# Patient Record
Sex: Female | Born: 1960 | Race: Black or African American | Hispanic: No | Marital: Married | Smoking: Current every day smoker
Health system: Southern US, Community
[De-identification: ages and names within clinical notes are randomized; demographics above are authoritative.]

## PROBLEM LIST (undated history)

## (undated) DIAGNOSIS — I219 Acute myocardial infarction, unspecified: Secondary | ICD-10-CM

## (undated) DIAGNOSIS — E7800 Pure hypercholesterolemia, unspecified: Secondary | ICD-10-CM

## (undated) DIAGNOSIS — I10 Essential (primary) hypertension: Secondary | ICD-10-CM

## (undated) DIAGNOSIS — G4730 Sleep apnea, unspecified: Secondary | ICD-10-CM

## (undated) DIAGNOSIS — I2510 Atherosclerotic heart disease of native coronary artery without angina pectoris: Secondary | ICD-10-CM

## (undated) DIAGNOSIS — E119 Type 2 diabetes mellitus without complications: Secondary | ICD-10-CM

## (undated) DIAGNOSIS — R42 Dizziness and giddiness: Secondary | ICD-10-CM

## (undated) DIAGNOSIS — I209 Angina pectoris, unspecified: Secondary | ICD-10-CM

## (undated) DIAGNOSIS — M1990 Unspecified osteoarthritis, unspecified site: Secondary | ICD-10-CM

## (undated) HISTORY — PX: ABDOMINAL HYSTERECTOMY: SHX81

## (undated) HISTORY — PX: FOOT SURGERY: SHX648

## (undated) HISTORY — PX: CARPAL TUNNEL RELEASE: SHX101

## (undated) HISTORY — PX: ELBOW SURGERY: SHX618

---

## 2012-07-11 ENCOUNTER — Other Ambulatory Visit: Payer: Self-pay | Admitting: Internal Medicine

## 2012-07-11 DIAGNOSIS — Z1231 Encounter for screening mammogram for malignant neoplasm of breast: Secondary | ICD-10-CM

## 2012-08-14 ENCOUNTER — Ambulatory Visit
Admission: RE | Admit: 2012-08-14 | Discharge: 2012-08-14 | Disposition: A | Discharge status: Home / Self Care | Payer: BC Managed Care – PPO | Source: Ambulatory Visit | Attending: Internal Medicine | Admitting: Internal Medicine

## 2012-08-14 DIAGNOSIS — Z1231 Encounter for screening mammogram for malignant neoplasm of breast: Secondary | ICD-10-CM

## 2012-08-20 ENCOUNTER — Encounter (HOSPITAL_COMMUNITY): Payer: Self-pay

## 2012-08-20 ENCOUNTER — Emergency Department (HOSPITAL_COMMUNITY)
Admission: EM | Admit: 2012-08-20 | Discharge: 2012-08-20 | Disposition: A | Discharge status: Home / Self Care | Payer: BC Managed Care – PPO | Attending: Emergency Medicine | Admitting: Emergency Medicine

## 2012-08-20 DIAGNOSIS — F17200 Nicotine dependence, unspecified, uncomplicated: Secondary | ICD-10-CM | POA: Insufficient documentation

## 2012-08-20 DIAGNOSIS — R209 Unspecified disturbances of skin sensation: Secondary | ICD-10-CM | POA: Insufficient documentation

## 2012-08-20 DIAGNOSIS — G5600 Carpal tunnel syndrome, unspecified upper limb: Secondary | ICD-10-CM

## 2012-08-20 NOTE — ED Notes (Signed)
Pt denies any injury to the hand or wrist, states that she just woke up this AM with this.

## 2012-08-20 NOTE — ED Notes (Signed)
Provider at bedside

## 2012-08-20 NOTE — Progress Notes (Signed)
Orthopedic Tech Progress Note Patient Details:  Tamara Copeland 12-05-1960 [id number] Velcro wrist splint applied to Right UE; tolerated well. Ortho Devices Type of Ortho Device: Velcro wrist splint Ortho Device/Splint Location: Right UE Ortho Device/Splint Interventions: Application   Asia R Thompson 08/20/2012, 10:42 AM

## 2012-08-20 NOTE — ED Provider Notes (Signed)
History     CSN: [id number]  Arrival date & time 08/20/12  0940   First MD Initiated Contact with Patient 08/20/12 [phone]      Chief Complaint  Patient presents with  . Arm Pain    Burning and tingling on right wrist with pain in the pinky.    (Consider location/radiation/quality/duration/timing/severity/associated sxs/prior treatment) HPI Comments: Patient presents today with a chief complaint of pain in her right wrist and numbness/tingling of the fingers of her right hand.  She reports that the symptoms woke her up from sleep last evening.  She reports that the numbness and tingling move from her arm distally to her fingers and does not move up her arm.  Both wrist pain and numbness of fingers worse when she is typing.  She denies any weakness.  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  She denies headache, vision changes, facial asymmetry, weakness, difficulty swallowing, or difficulty speaking.  She denies CP or SOB.  No history of HTN, Hyperlipidemia, or DM.  The history is provided by the patient.    No past medical history on file.  Past Surgical History  Procedure Date  . Foot surgery   . Abdominal hysterectomy     No family history on file.  History  Substance Use Topics  . Smoking status: Current Every Day Smoker -- 1.0 packs/day  . Smokeless tobacco: Not on file  . Alcohol Use: No    OB History    Grav Para Term Preterm Abortions TAB SAB Ect Mult Living                  Review of Systems  Constitutional: Negative for fever and chills.  HENT: Negative for neck pain and neck stiffness.   Respiratory: Negative for shortness of breath.   Cardiovascular: Negative for chest pain.  Musculoskeletal: Negative for gait problem.  Neurological: Positive for numbness. Negative for dizziness, syncope, facial asymmetry, weakness, light-headedness and headaches.  All other systems reviewed and are negative.    Allergies    Review of patient's allergies indicates no known allergies.  Home Medications  No current outpatient prescriptions on file.  BP 140/91  Pulse 77  Temp 98.7 F (37.1 C) (Oral)  Resp 16  Ht 5\' 3"  (1.6 m)  Wt 191 lb (86.637 kg)  BMI 33.83 kg/m2  SpO2 98%  Physical Exam  Nursing note and vitals reviewed. Constitutional: She appears well-developed and well-nourished. No distress.  HENT:  Head: Normocephalic and atraumatic.  Mouth/Throat: Oropharynx is clear and moist.  Eyes: EOM are normal. Pupils are equal, round, and reactive to light.  Neck: Normal range of motion. Neck supple.  Cardiovascular: Normal rate, regular rhythm, normal heart sounds and intact distal pulses.   Pulmonary/Chest: Effort normal and breath sounds normal.  Neurological: She is alert. She has normal strength. No cranial nerve deficit or sensory deficit. Coordination and gait normal.  Reflex Scores:      Brachioradialis reflexes are 2+ on the right side and 2+ on the left side.      Positive Phalen's sign with right wrist Positive Tinel's sign with right wrist Grip strength 5/5 bilaterally Distal sensation of all fingers of the right hand intact  Skin: Skin is warm and dry. She is not diaphoretic. No erythema.  Psychiatric: She has a normal mood and affect.    ED Course  Procedures (including critical care time)  Labs Reviewed - No data  to display No results found.   1. Carpal tunnel syndrome       MDM  Patient with symptoms consistent with Carpal Tunnel.  No focal weakness.  Normal neurological exam.  Positive Phalen's sign.  Patient given wrist splint and discharged home.  Return precautions discussed.          Pascal Lux Richmond, PA-C 08/20/12 2132

## 2012-08-27 NOTE — ED Provider Notes (Signed)
Medical screening examination/treatment/procedure(s) were performed by non-physician practitioner and as supervising physician I was immediately available for consultation/collaboration.   Carleene Cooper III, MD 08/27/12 2049

## 2013-11-07 IMAGING — MG MM DIGITAL SCREENING BILAT W/ CAD
4 series · 4 of 4 positions shown · non-contrast
Comparison: Previous exams.

***ADDENDUM*** CREATED: 08/20/2012 [DATE]
CLINICAL DATA: Screening.

DIGITAL BILATERAL SCREENING MAMMOGRAM WITH CAD
BILATERAL DIGITAL SCREENING MAMMOGRAM WITH CAD

[R CC]
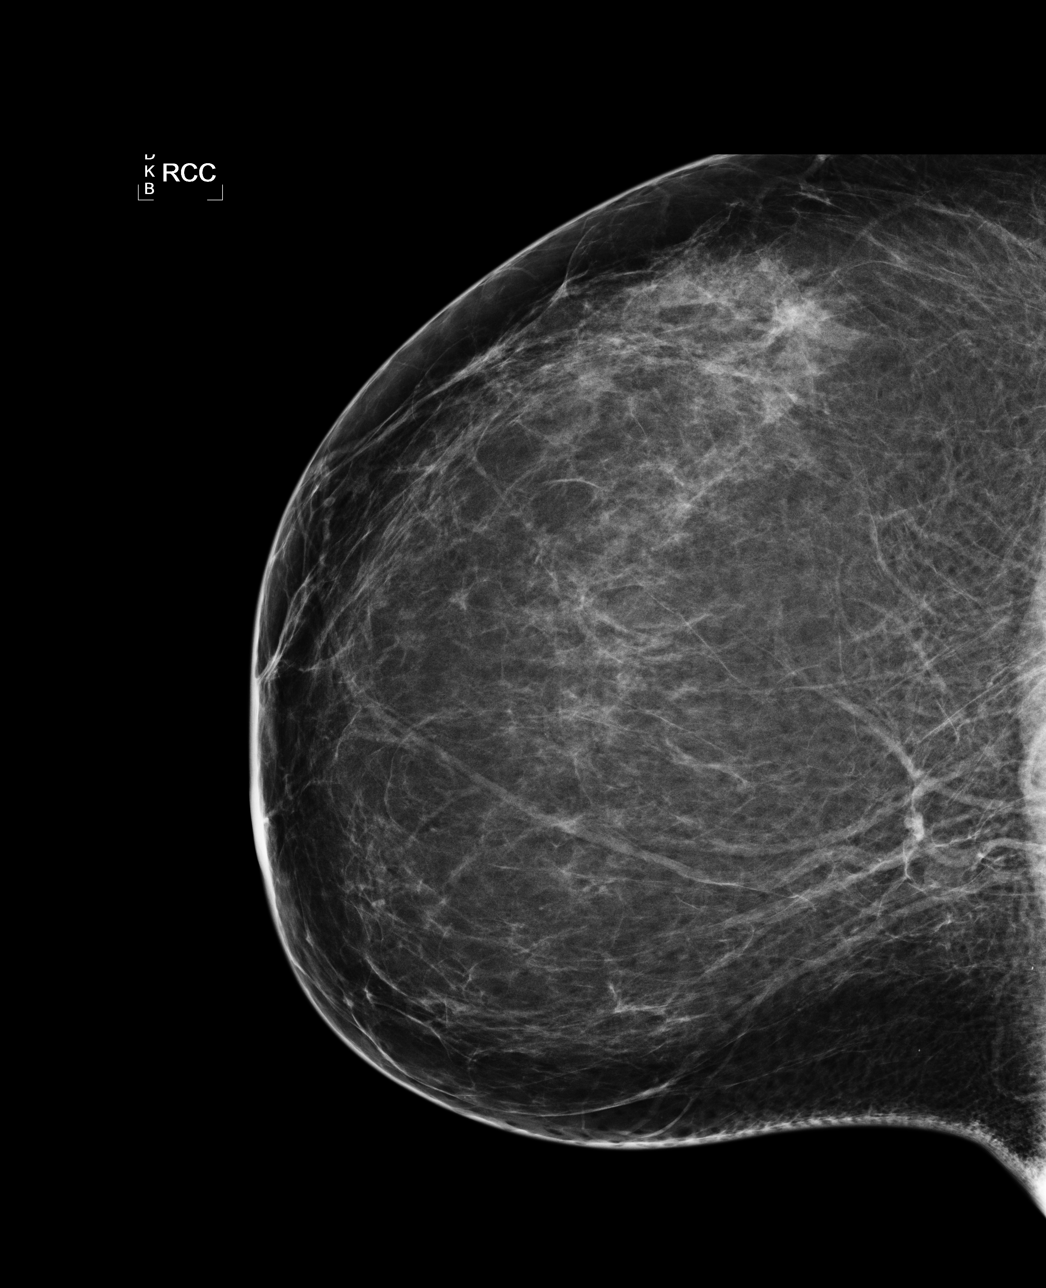

[L CC]
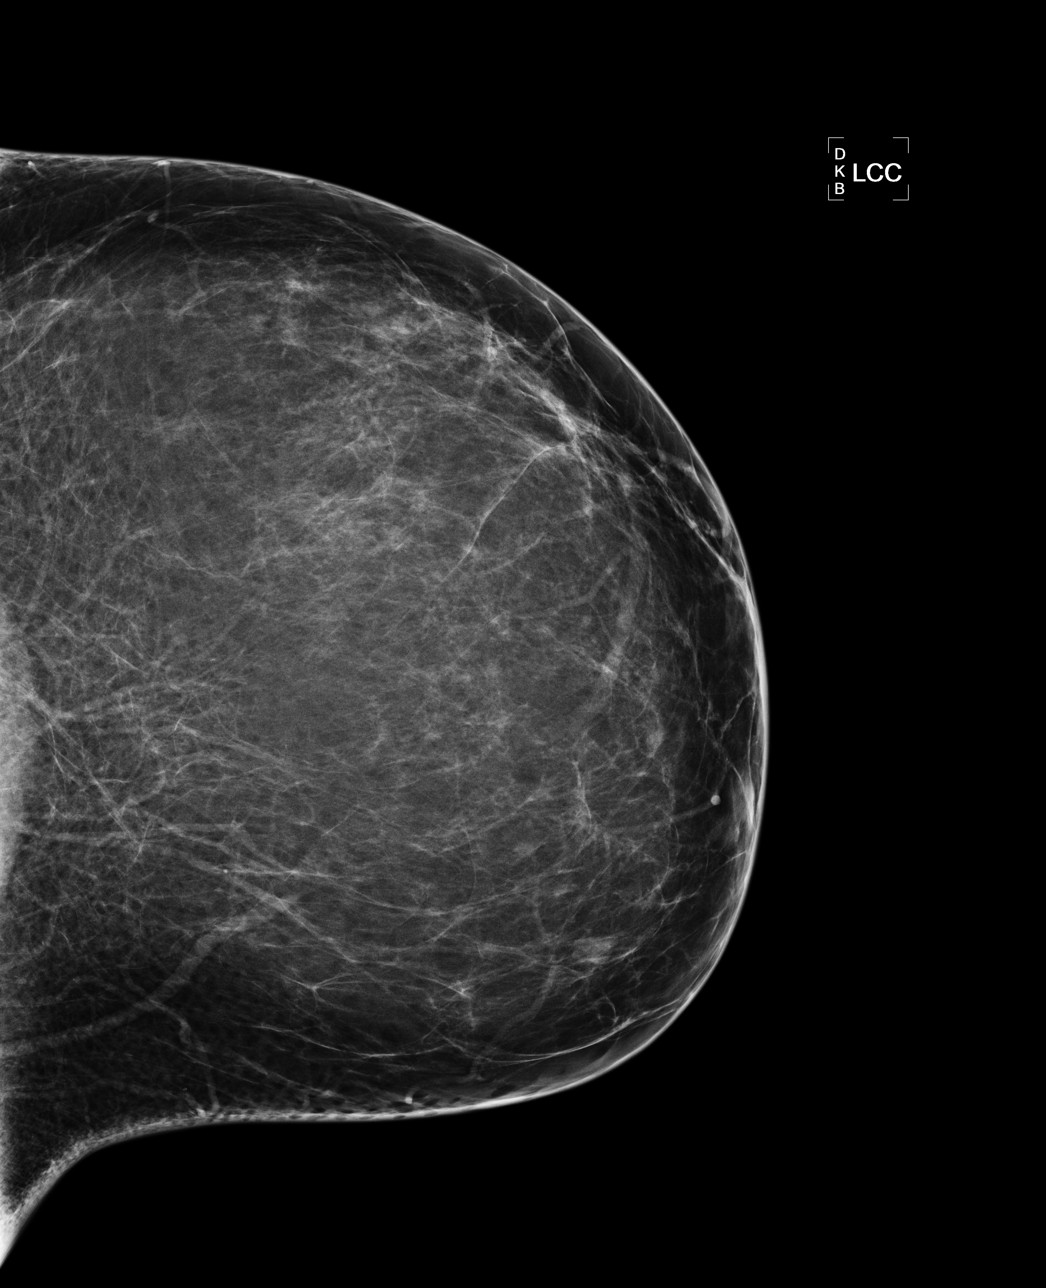

[L MLO]
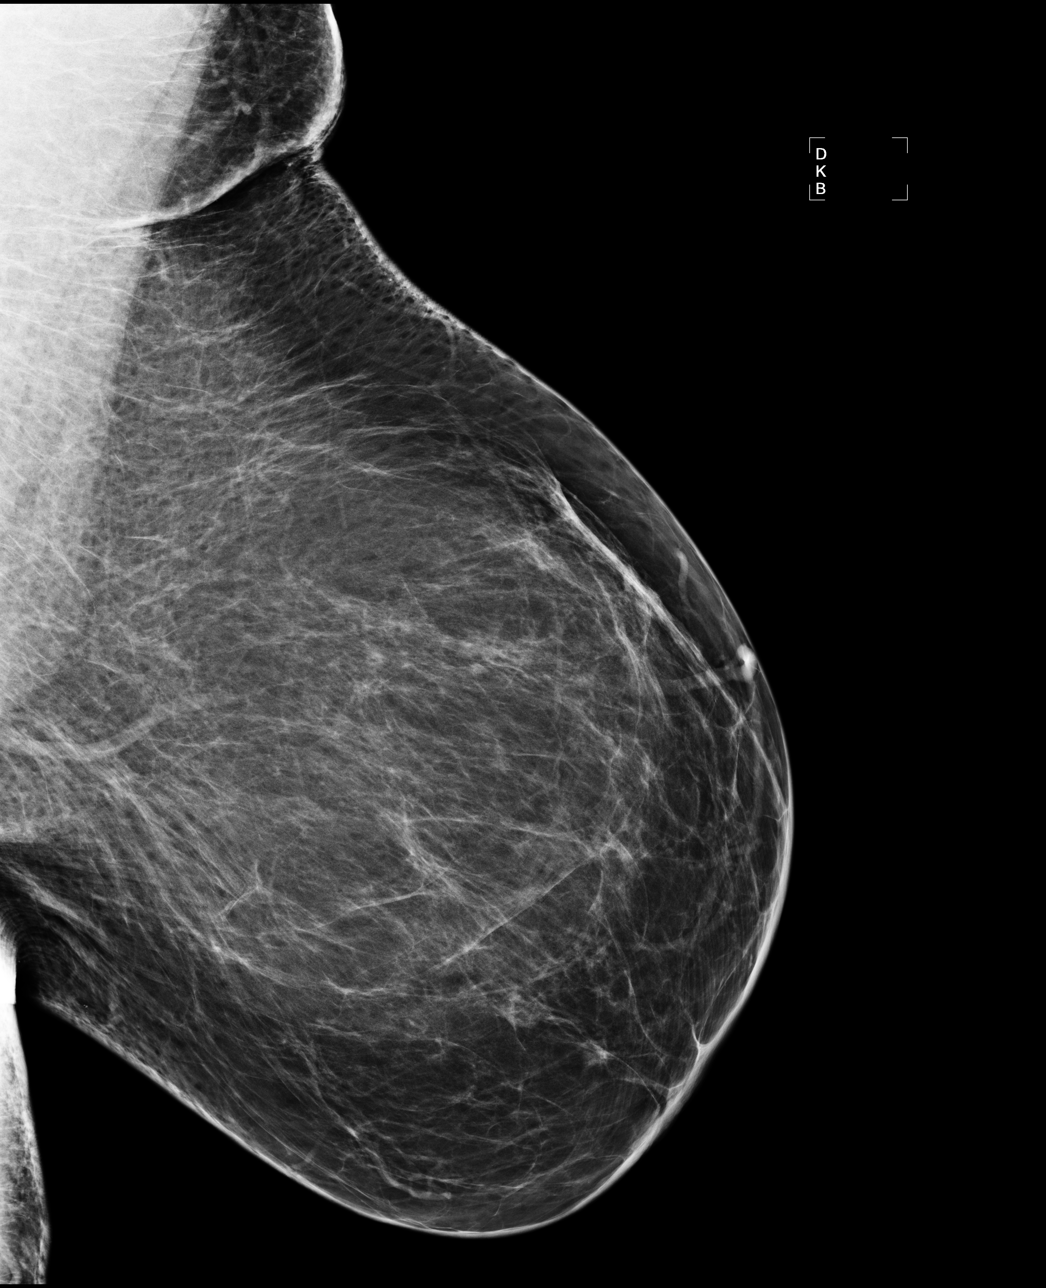

[R MLO]
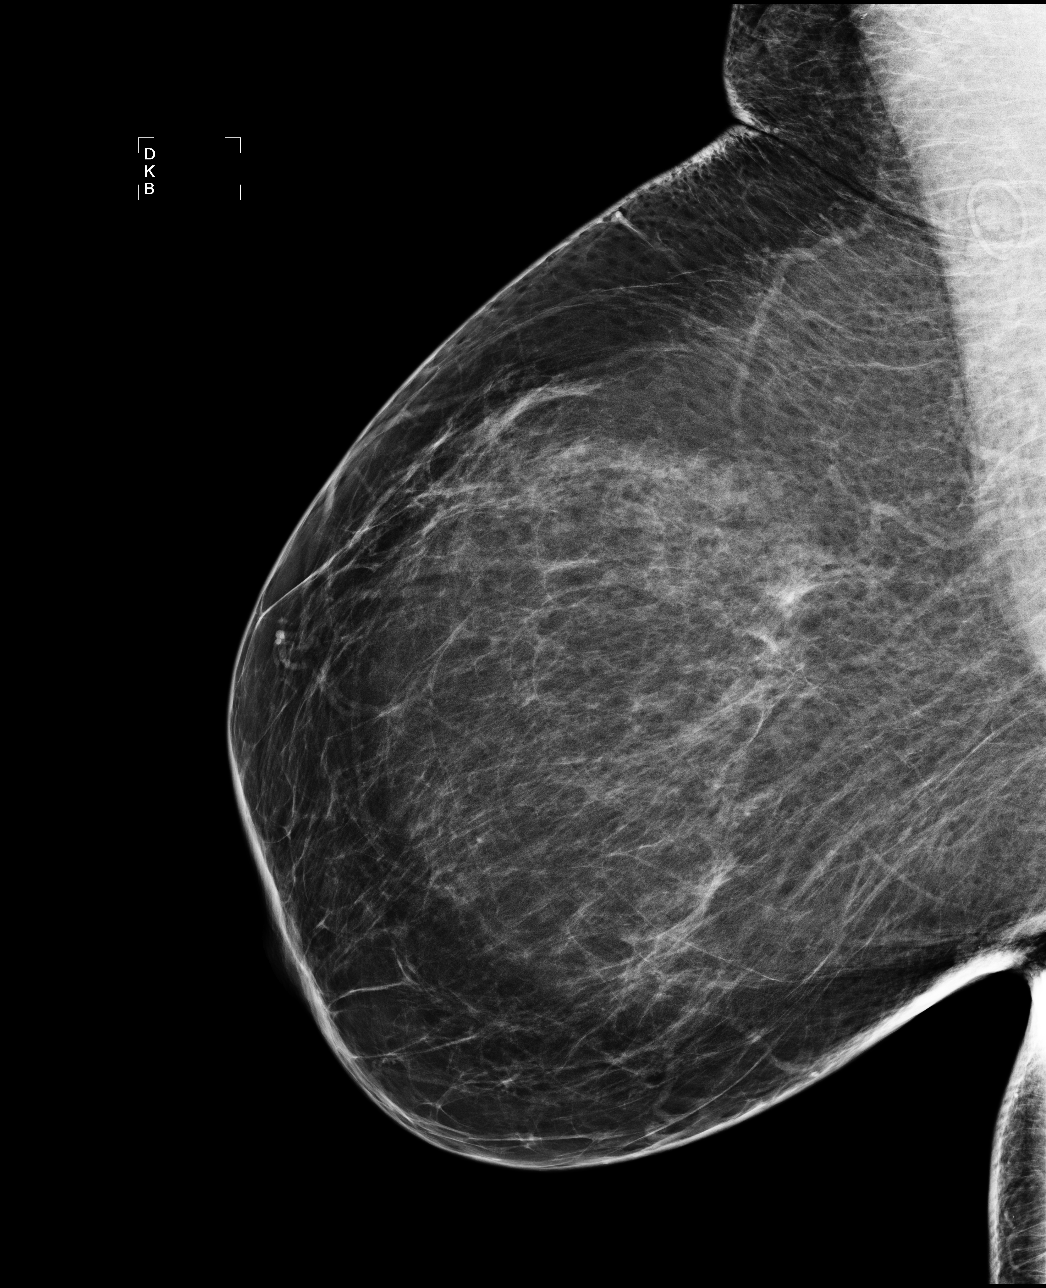

[4 of 4 positions shown; findings below may reference images not displayed]

FINDINGS: There are scattered fibroglandular densities. No
suspicious masses, architectural distortion, or calcifications are
present.

Images were processed with CAD.
IMPRESSION: No mammographic evidence of malignancy.

A result letter of this screening mammogram will be mailed directly
to the patient.

RECOMMENDATION:
Screening mammogram in one year. (Code:9V-[id number])

BI-RADS CATEGORY 1:  Negative.

***END ADDENDUM*** SIGNED BY: Qomandan Tiger, M.D.
FINDINGS: Prior films are needed for interpretation.
IMPRESSION: Prior exams will be requested for comparison.  Following comparison
with prior studies an addendum will be made regarding further
recommendations.

Images were processed with CAD.

BI-RADS CATEGORY 0:  Incomplete.  Need additional imaging
evaluation and/or prior mammograms for comparison.

## 2014-05-19 ENCOUNTER — Ambulatory Visit (INDEPENDENT_AMBULATORY_CARE_PROVIDER_SITE_OTHER): Payer: BC Managed Care – PPO

## 2014-05-19 ENCOUNTER — Encounter: Payer: Self-pay | Admitting: Podiatry

## 2014-05-19 ENCOUNTER — Ambulatory Visit (INDEPENDENT_AMBULATORY_CARE_PROVIDER_SITE_OTHER): Payer: BC Managed Care – PPO | Admitting: Podiatry

## 2014-05-19 VITALS — BP 129/85 | HR 85 | Resp 16 | Ht 63.0 in | Wt 191.0 lb

## 2014-05-19 DIAGNOSIS — M21619 Bunion of unspecified foot: Secondary | ICD-10-CM

## 2014-05-19 DIAGNOSIS — M2010 Hallux valgus (acquired), unspecified foot: Secondary | ICD-10-CM

## 2014-05-19 DIAGNOSIS — M2140 Flat foot [pes planus] (acquired), unspecified foot: Secondary | ICD-10-CM

## 2014-05-19 DIAGNOSIS — M2142 Flat foot [pes planus] (acquired), left foot: Secondary | ICD-10-CM

## 2014-05-19 DIAGNOSIS — M2141 Flat foot [pes planus] (acquired), right foot: Secondary | ICD-10-CM

## 2014-05-19 NOTE — Progress Notes (Signed)
   Subjective:    Patient ID: Tamara Copeland, female    DOB: 01/02/1961, 53 y.o.   MRN: [id number]  HPI Comments: My feet have been hurting me since i was 53 years old. I have had orthotics and surgery. The bottom of the feet hurt, mostly in the ball of the foot. It is a constant sting. It just seems to be getting worse. Having back problems now and dont know if it is feet related. I have painful callus on the bottom of the feet as well. Denies any trauma. No other complaints at this time.   Foot Pain      Review of Systems  All other systems reviewed and are negative.      Objective:   Physical Exam AAO x3, NAD DP/PT pulses palpable b/l. CRT < 3 sec Decrease in the medial arch heigh upon weightbearing. Able to preform single and double heel rise. Mild equinus.  Diffuse discomfort across the metatarsal heads with atrophy of the plantar fat pa b/l. Mild HAV MMT 5/5  No open lesions      Assessment & Plan:  53 year old female with a bilateral pes planus, metatarsalgia. -X-rays were obtained. See the x-ray report for full details. No acute fracture  -Discussed both conservative and surgical intervention with the patient in detail including alternatives, risks, complications. -At this time recommended orthotic therapy to help support the arch of her feet as old provide padding to the metatarsal heads. Patient will think about custom orthotics and will call the office to be scanned. -Dispensed metatarsal pads to help offload the metatarsal heads. -Followup as needed

## 2023-08-22 ENCOUNTER — Ambulatory Visit (HOSPITAL_BASED_OUTPATIENT_CLINIC_OR_DEPARTMENT_OTHER)
Admission: RE | Admit: 2023-08-22 | Discharge: 2023-08-22 | Disposition: A | Discharge status: Home / Self Care | Payer: Medicare HMO | Source: Ambulatory Visit | Attending: Adult Health Nurse Practitioner | Admitting: Adult Health Nurse Practitioner

## 2023-08-22 ENCOUNTER — Other Ambulatory Visit (HOSPITAL_BASED_OUTPATIENT_CLINIC_OR_DEPARTMENT_OTHER): Payer: Self-pay | Admitting: Adult Health Nurse Practitioner

## 2023-08-22 DIAGNOSIS — M545 Low back pain, unspecified: Secondary | ICD-10-CM

## 2023-08-27 HISTORY — PX: RIGHT AND LEFT HEART CATH: CATH118262

## 2023-11-18 ENCOUNTER — Other Ambulatory Visit: Payer: Self-pay | Admitting: Neurosurgery

## 2023-12-02 ENCOUNTER — Other Ambulatory Visit: Payer: Self-pay | Admitting: Neurosurgery

## 2023-12-10 NOTE — Pre-Procedure Instructions (Signed)
 Surgical Instructions   Your procedure is scheduled on December 25, 2023. Report to [facility] Main Entrance "A" at 5:30 A.M., then check in with the Admitting office. Any questions or running late day of surgery: call [phone]  Questions prior to your surgery date: call [phone], Monday-Friday, 8am-4pm. If you experience any cold or flu symptoms such as cough, fever, chills, shortness of breath, etc. between now and your scheduled surgery, please notify us at the above number.     Remember:  Do not eat or drink after midnight the night before your surgery    Take these medicines the morning of surgery with A SIP OF WATER: amLODipine (NORVASC)  metoprolol tartrate (LOPRESSOR)    May take these medicines IF NEEDED: albuterol (VENTOLIN HFA) inhaler - please bring inhaler with you morning of surgery nitroGLYCERIN (NITROSTAT) - if dose taken prior to surgery, please call either of the above phone numbers   Follow your surgeon's instructions on when to stop Aspirin.  If no instructions were given by your surgeon then you will need to call the office to get those instructions.     One week prior to surgery, STOP taking any Aleve, Naproxen, Ibuprofen, Motrin, Advil, Goody's, BC's, all herbal medications, fish oil, and non-prescription vitamins.   WHAT DO I DO ABOUT MY DIABETES MEDICATION?   Do not take metFORMIN (GLUCOPHAGE) or pioglitazone (ACTOS) the morning of surgery.  THE NIGHT BEFORE SURGERY, take 5 units of insulin glargine (LANTUS SOLOSTAR).      STOP taking your empagliflozin (JARDIANCE) three days prior to surgery. Your last dose will be March 22nd.   HOW TO MANAGE YOUR DIABETES BEFORE AND AFTER SURGERY  Why is it important to control my blood sugar before and after surgery? Improving blood sugar levels before and after surgery helps healing and can limit problems. A way of improving blood sugar control is eating a healthy diet by:  Eating less sugar and  carbohydrates  Increasing activity/exercise  Talking with your doctor about reaching your blood sugar goals High blood sugars (greater than 180 mg/dL) can raise your risk of infections and slow your recovery, so you will need to focus on controlling your diabetes during the weeks before surgery. Make sure that the doctor who takes care of your diabetes knows about your planned surgery including the date and location.  How do I manage my blood sugar before surgery? Check your blood sugar at least 4 times a day, starting 2 days before surgery, to make sure that the level is not too high or low.  Check your blood sugar the morning of your surgery when you wake up and every 2 hours until you get to the Short Stay unit.  If your blood sugar is less than 70 mg/dL, you will need to treat for low blood sugar: Do not take insulin. Treat a low blood sugar (less than 70 mg/dL) with  cup of clear juice (cranberry or apple), 4 glucose tablets, OR glucose gel. Recheck blood sugar in 15 minutes after treatment (to make sure it is greater than 70 mg/dL). If your blood sugar is not greater than 70 mg/dL on recheck, call [phone] for further instructions. Report your blood sugar to the short stay nurse when you get to Short Stay.  If you are admitted to the hospital after surgery: Your blood sugar will be checked by the staff and you will probably be given insulin after surgery (instead of oral diabetes medicines) to make sure you have good  blood sugar levels. The goal for blood sugar control after surgery is 80-180 mg/dL.                      Do NOT Smoke (Tobacco/Vaping) for 24 hours prior to your procedure.  If you use a CPAP at night, you may bring your mask/headgear for your overnight stay.   You will be asked to remove any contacts, glasses, piercing's, hearing aid's, dentures/partials prior to surgery. Please bring cases for these items if needed.    Patients discharged the day of surgery will  not be allowed to drive home, and someone needs to stay with them for 24 hours.  SURGICAL WAITING ROOM VISITATION Patients may have no more than 2 support people in the waiting area - these visitors may rotate.   Pre-op nurse will coordinate an appropriate time for 1 ADULT support person, who may not rotate, to accompany patient in pre-op.  Children under the age of 50 must have an adult with them who is not the patient and must remain in the main waiting area with an adult.  If the patient needs to stay at the hospital during part of their recovery, the visitor guidelines for inpatient rooms apply.  Please refer to the [facility] website for the visitor guidelines for any additional information.   If you received a COVID test during your pre-op visit  it is requested that you wear a mask when out in public, stay away from anyone that may not be feeling well and notify your surgeon if you develop symptoms. If you have been in contact with anyone that has tested positive in the last 10 days please notify you surgeon.      Pre-operative 5 CHG Bathing Instructions   You can play a key role in reducing the risk of infection after surgery. Your skin needs to be as free of germs as possible. You can reduce the number of germs on your skin by washing with CHG (chlorhexidine gluconate) soap before surgery. CHG is an antiseptic soap that kills germs and continues to kill germs even after washing.   DO NOT use if you have an allergy to chlorhexidine/CHG or antibacterial soaps. If your skin becomes reddened or irritated, stop using the CHG and notify one of our RNs at [phone].   Please shower with the CHG soap starting 4 days before surgery using the following schedule:     Please keep in mind the following:  DO NOT shave, including legs and underarms, starting the day of your first shower.   You may shave your face at any point before/day of surgery.  Place clean sheets on your bed the day  you start using CHG soap. Use a clean washcloth (not used since being washed) for each shower. DO NOT sleep with pets once you start using the CHG.   CHG Shower Instructions:  Wash your face and private area with normal soap. If you choose to wash your hair, wash first with your normal shampoo.  After you use shampoo/soap, rinse your hair and body thoroughly to remove shampoo/soap residue.  Turn the water OFF and apply about 3 tablespoons (45 ml) of CHG soap to a CLEAN washcloth.  Apply CHG soap ONLY FROM YOUR NECK DOWN TO YOUR TOES (washing for 3-5 minutes)  DO NOT use CHG soap on face, private areas, open wounds, or sores.  Pay special attention to the area where your surgery is being performed.  If you  are having back surgery, having someone wash your back for you may be helpful. Wait 2 minutes after CHG soap is applied, then you may rinse off the CHG soap.  Pat dry with a clean towel  Put on clean clothes/pajamas   If you choose to wear lotion, please use ONLY the CHG-compatible lotions that are listed below.  Additional instructions for the day of surgery: DO NOT APPLY any lotions, deodorants, cologne, or perfumes.   Do not bring valuables to the hospital. [facility] is not responsible for any belongings/valuables. Do not wear nail polish, gel polish, artificial nails, or any other type of covering on natural nails (fingers and toes) Do not wear jewelry or makeup Put on clean/comfortable clothes.  Please brush your teeth.  Ask your nurse before applying any prescription medications to the skin.     CHG Compatible Lotions   Aveeno Moisturizing lotion  Cetaphil Moisturizing Cream  Cetaphil Moisturizing Lotion  Clairol Herbal Essence Moisturizing Lotion, Dry Skin  Clairol Herbal Essence Moisturizing Lotion, Extra Dry Skin  Clairol Herbal Essence Moisturizing Lotion, Normal Skin  Curel Age Defying Therapeutic Moisturizing Lotion with Alpha Hydroxy  Curel Extreme Care Body  Lotion  Curel Soothing Hands Moisturizing Hand Lotion  Curel Therapeutic Moisturizing Cream, Fragrance-Free  Curel Therapeutic Moisturizing Lotion, Fragrance-Free  Curel Therapeutic Moisturizing Lotion, Original Formula  Eucerin Daily Replenishing Lotion  Eucerin Dry Skin Therapy Plus Alpha Hydroxy Crme  Eucerin Dry Skin Therapy Plus Alpha Hydroxy Lotion  Eucerin Original Crme  Eucerin Original Lotion  Eucerin Plus Crme Eucerin Plus Lotion  Eucerin TriLipid Replenishing Lotion  Keri Anti-Bacterial Hand Lotion  Keri Deep Conditioning Original Lotion Dry Skin Formula Softly Scented  Keri Deep Conditioning Original Lotion, Fragrance Free Sensitive Skin Formula  Keri Lotion Fast Absorbing Fragrance Free Sensitive Skin Formula  Keri Lotion Fast Absorbing Softly Scented Dry Skin Formula  Keri Original Lotion  Keri Skin Renewal Lotion Keri Silky Smooth Lotion  Keri Silky Smooth Sensitive Skin Lotion  Nivea Body Creamy Conditioning Oil  Nivea Body Extra Enriched Lotion  Nivea Body Original Lotion  Nivea Body Sheer Moisturizing Lotion Nivea Crme  Nivea Skin Firming Lotion  NutraDerm 30 Skin Lotion  NutraDerm Skin Lotion  NutraDerm Therapeutic Skin Cream  NutraDerm Therapeutic Skin Lotion  ProShield Protective Hand Cream  Provon moisturizing lotion  Please read over the following fact sheets that you were given.

## 2023-12-11 ENCOUNTER — Encounter (HOSPITAL_COMMUNITY)
Admission: RE | Admit: 2023-12-11 | Discharge: 2023-12-11 | Disposition: A | Discharge status: Home / Self Care | Payer: Medicare HMO | Source: Ambulatory Visit | Attending: Neurosurgery | Admitting: Neurosurgery

## 2023-12-11 ENCOUNTER — Other Ambulatory Visit: Payer: Self-pay

## 2023-12-11 ENCOUNTER — Encounter (HOSPITAL_COMMUNITY): Payer: Self-pay

## 2023-12-11 VITALS — BP 141/75 | HR 56 | Temp 98.2°F | Resp 17 | Ht 63.0 in | Wt 189.3 lb

## 2023-12-11 DIAGNOSIS — E7800 Pure hypercholesterolemia, unspecified: Secondary | ICD-10-CM | POA: Insufficient documentation

## 2023-12-11 DIAGNOSIS — G4733 Obstructive sleep apnea (adult) (pediatric): Secondary | ICD-10-CM | POA: Diagnosis not present

## 2023-12-11 DIAGNOSIS — E119 Type 2 diabetes mellitus without complications: Secondary | ICD-10-CM | POA: Diagnosis not present

## 2023-12-11 DIAGNOSIS — Z01818 Encounter for other preprocedural examination: Secondary | ICD-10-CM | POA: Diagnosis present

## 2023-12-11 DIAGNOSIS — J039 Acute tonsillitis, unspecified: Secondary | ICD-10-CM | POA: Insufficient documentation

## 2023-12-11 DIAGNOSIS — M4712 Other spondylosis with myelopathy, cervical region: Secondary | ICD-10-CM | POA: Diagnosis not present

## 2023-12-11 DIAGNOSIS — Z794 Long term (current) use of insulin: Secondary | ICD-10-CM | POA: Diagnosis not present

## 2023-12-11 DIAGNOSIS — Z01812 Encounter for preprocedural laboratory examination: Secondary | ICD-10-CM | POA: Diagnosis not present

## 2023-12-11 DIAGNOSIS — I252 Old myocardial infarction: Secondary | ICD-10-CM | POA: Insufficient documentation

## 2023-12-11 DIAGNOSIS — Z955 Presence of coronary angioplasty implant and graft: Secondary | ICD-10-CM | POA: Insufficient documentation

## 2023-12-11 DIAGNOSIS — I2510 Atherosclerotic heart disease of native coronary artery without angina pectoris: Secondary | ICD-10-CM | POA: Insufficient documentation

## 2023-12-11 DIAGNOSIS — Z87891 Personal history of nicotine dependence: Secondary | ICD-10-CM | POA: Diagnosis not present

## 2023-12-11 DIAGNOSIS — I10 Essential (primary) hypertension: Secondary | ICD-10-CM | POA: Diagnosis not present

## 2023-12-11 HISTORY — DX: Type 2 diabetes mellitus without complications: E11.9

## 2023-12-11 HISTORY — DX: Essential (primary) hypertension: I10

## 2023-12-11 HISTORY — DX: Sleep apnea, unspecified: G47.30

## 2023-12-11 HISTORY — DX: Unspecified osteoarthritis, unspecified site: M19.90

## 2023-12-11 HISTORY — DX: Dizziness and giddiness: R42

## 2023-12-11 HISTORY — DX: Atherosclerotic heart disease of native coronary artery without angina pectoris: I25.10

## 2023-12-11 HISTORY — DX: Pure hypercholesterolemia, unspecified: E78.00

## 2023-12-11 HISTORY — DX: Acute myocardial infarction, unspecified: I21.9

## 2023-12-11 HISTORY — DX: Angina pectoris, unspecified: I20.9

## 2023-12-11 LAB — TYPE AND SCREEN
ABO/RH(D): A POS
Antibody Screen: NEGATIVE

## 2023-12-11 LAB — CBC
HCT: 41.9 % (ref 36.0–46.0)
Hemoglobin: 13.1 g/dL (ref 12.0–15.0)
MCH: 26.7 pg (ref 26.0–34.0)
MCHC: 31.3 g/dL (ref 30.0–36.0)
MCV: 85.3 fL (ref 80.0–100.0)
Platelets: 274 10*3/uL (ref 150–400)
RBC: 4.91 MIL/uL (ref 3.87–5.11)
RDW: 16.2 % — ABNORMAL HIGH (ref 11.5–15.5)
WBC: 7.6 10*3/uL (ref 4.0–10.5)
nRBC: 0 % (ref 0.0–0.2)

## 2023-12-11 LAB — BASIC METABOLIC PANEL
Anion gap: 7 (ref 5–15)
BUN: 14 mg/dL (ref 6–20)
CO2: 27 mmol/L (ref 22–32)
Calcium: 9.9 mg/dL (ref 8.9–10.3)
Chloride: 104 mmol/L (ref 98–111)
Creatinine, Ser: 1.3 mg/dL — ABNORMAL HIGH (ref 0.44–1.00)
GFR, Estimated: 47 mL/min — ABNORMAL LOW (ref >60)
Glucose, Bld: 150 mg/dL — ABNORMAL HIGH (ref 70–99)
Potassium: 4.3 mmol/L (ref 3.5–5.1)
Sodium: 138 mmol/L (ref 135–145)

## 2023-12-11 LAB — SURGICAL PCR SCREEN
MRSA, PCR: NEGATIVE
Staphylococcus aureus: NEGATIVE

## 2023-12-11 LAB — GLUCOSE, CAPILLARY: Glucose-Capillary: 182 mg/dL — ABNORMAL HIGH (ref 70–99)

## 2023-12-11 NOTE — Progress Notes (Addendum)
 PCP - Dr. Milford Cage Cardiologist - Dr. Cathlean Cower Endocrinologist: Dr. Corwin Levins  PPM/ICD - denies Device Orders - na Rep Notified - na  Chest x-ray - 08/25/2023 EKG - 08/25/2023, requested from [facility] Stress Test - 08/26/2023 ECHO - 03/12/2022 Cardiac Cath - 08/27/2023  Sleep Study - diagnosed with sleep apnea CPAP - nightly  Type II diabetic.  Blood sugar 182 at PAT appointment. Has CGM Freestyle Libre 3 + currently in left arm.  A1C 7.9 11/08/2023 Fasting Blood Sugar :  110-120 Checks Blood Sugar: continuous  Metformin, hold DOS Actos, hold DOS Jardiance, hold 72 hours, last dose 12/21/2023 Insulin glargine, use 5 units which is 50% of regular dose  Blood Thinner Instructions: denies Aspirin Instructions: follow surgeon's instructions, last dose to be 12/20/2023  ERAS Protcol -NPO  Anesthesia review: CAD, angina, HTN, HLD, DM, MI, OSA  Patient denies shortness of breath, fever, cough and chest pain at PAT appointment   All instructions explained to the patient, with a verbal understanding of the material. Patient agrees to go over the instructions while at home for a better understanding. Patient also instructed to self quarantine after being tested for COVID-19. The opportunity to ask questions was provided.

## 2023-12-12 NOTE — Anesthesia Preprocedure Evaluation (Addendum)
 Anesthesia Evaluation  Patient identified by MRN, date of birth, ID band Patient awake    Reviewed: Allergy & Precautions, NPO status , Patient's Chart, lab work & pertinent test results, reviewed documented beta blocker date and time   Airway Mallampati: III  TM Distance: >3 FB Neck ROM: Full    Dental  (+) Dental Advisory Given, Upper Dentures   Pulmonary sleep apnea and Continuous Positive Airway Pressure Ventilation , former smoker   Pulmonary exam normal        Cardiovascular hypertension, Pt. on medications and Pt. on home beta blockers + CAD, + Past MI and + Cardiac Stents  Normal cardiovascular exam     Neuro/Psych  Vertigo   Neuromuscular disease    GI/Hepatic negative GI ROS, Neg liver ROS,,,  Endo/Other  diabetes, Type 2, Insulin Dependent, Oral Hypoglycemic Agents    Renal/GU Renal InsufficiencyRenal disease     Musculoskeletal  (+) Arthritis ,    Abdominal   Peds  Hematology negative hematology ROS (+)   Anesthesia Other Findings   Reproductive/Obstetrics                             Anesthesia Physical Anesthesia Plan  ASA: 3  Anesthesia Plan: General   Post-op Pain Management: Ofirmev IV (intra-op)* and Ketamine IV*   Induction: Intravenous  PONV Risk Score and Plan: 3 and Treatment may vary due to age or medical condition, Ondansetron, Dexamethasone and Midazolam  Airway Management Planned: Oral ETT and Video Laryngoscope Planned  Additional Equipment: ClearSight  Intra-op Plan:   Post-operative Plan: Extubation in OR  Informed Consent: I have reviewed the patients History and Physical, chart, labs and discussed the procedure including the risks, benefits and alternatives for the proposed anesthesia with the patient or authorized representative who has indicated his/her understanding and acceptance.     Dental advisory given  Plan Discussed with: CRNA and  Anesthesiologist  Anesthesia Plan Comments: (PAT note written by Shonna Chock, PA-C.  )       Anesthesia Quick Evaluation

## 2023-12-12 NOTE — Progress Notes (Signed)
 Anesthesia Chart Review:  Case: [id number] Date/Time: 12/25/23 0715   Procedures:      POSTERIOR CERVICAL LAM, C23,C34,C45,C56,C67; POSTERIOR INSTRUMENTATION AND FUSION C2-T1     Application of O-Arm   Anesthesia type: General   Pre-op diagnosis: CERVICAL SPONDYLOSIS WITH MYELOPATHY   Location: [facility] ROOM 21 / [facility]   Surgeons: Tressie Stalker, MD       DISCUSSION: Patient is a 63 year old female scheduled for the above procedure.  History includes former smoker (quit 10/02/15), HTN, DM2, CAD (inferior STEMI, DES mid RCA 02/17/19), hypercholesterolemia, OSA (uses CPAP), vertigo, hysterectomy, excision left axillary ruptured epidermal inclusion cyst (02/12/23).  Last cardiology follow-up was on 11/26/23 with Levonne Lapping, NP follow-up admission for chest pain in late 08/2023. CTA negative for PE. Troponins negative. She had an overall low risk but with possible small area of inferior ischemia on 08/26/23 nuclear stress test. A LHC was recommended which was done on 08/27/23. She had "approximately 20% blockage to the stented RCA. Otherwise she is luminal irregularities. Patient is maintained on Crestor, metoprolol, and aspirin. Chest pain was thought to be related to esophageal irritation or ulcer" (likely exacerbated by clindamycin which she was taking for tonsillar abscess). She was prescribed Mylanta. She follow-up visit she reported taking one dose of nitroglycerin for chest pain lasting < 1 minute since her discharge. Overall felt to be doing well, and no additional changes or testing ordered. Follow-up with Dr. Dot Been is scheduled for 06/23/24.   Neck CT from 08/21/23 at time of right peritonsillar abscess also showed cervical spine stenosis that might be resulting in compressive myelopathy. She was referred to neurosurgery. 10/31/23 MRI C-spine findings included showed severe spinal canal stenosis with spinal cord flattening at C3-4, C5-6, C6-7. Symptoms include numbness and tingling in her RUE  and unsteady gait.   A1c 7.9% on 11/08/23. She follows with endocrinologist Dr. Katrinka Blazing. She has a [employer] 3, currently in left arm.  She is on metformin, Actos, Jardiance, and insulin glargine. She will hold Jardiance after 12/21/23 dose.   She reported last ASA scheduled for 12/20/23.   I called and spoke with patient. She denied any recurrent nitroglycerin use and felt she had been stable from a CV standpoint since her recent cardiology follow-up last month. She was told chest symptoms may have been related to her c-spine issues. As above, her 08/2023 cath showed minimal LAD and LCX disease (20%), patent RCA stent, and stable 40-50% proximal RCA lesion. She did not require a new stent. Aggressive secondary prevention and optimizing medical therapy recommended. Reviewed with anesthesiologist Karna Christmas, MD.  Anesthesia team to evaluate on the day of surgery. Awaiting last EKG from [facility].   VS: BP (!) 141/75   Pulse (!) 56   Temp 36.8 C   Resp 17   Ht 5\' 3"  (1.6 m)   Wt 85.9 kg   SpO2 99%   BMI 33.53 kg/m   PROVIDERS: Melvenia Beam, MD is PCP Cathlean Cower, MD is cardiologist Corwin Levins, MD is endocrinologist   LABS: Labs reviewed: Acceptable for surgery. (all labs ordered are listed, but only abnormal results are displayed)  Labs Reviewed  GLUCOSE, CAPILLARY - Abnormal; Notable for the following components:      Result Value   Glucose-Capillary 182 (*)    All other components within normal limits  BASIC METABOLIC PANEL - Abnormal; Notable for the following components:   Glucose, Bld 150 (*)    Creatinine, Ser 1.30 (*)  GFR, Estimated 47 (*)    All other components within normal limits  CBC - Abnormal; Notable for the following components:   RDW 16.2 (*)    All other components within normal limits  SURGICAL PCR SCREEN  TYPE AND SCREEN  A1c 7.9% on 11/08/23.    IMAGES: MRI C-spine 10/31/23 ([facility]): IMPRESSION: 1. Intermittently  motion degraded examination. 2. 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 3. At C3-C4, there is multifactorial severe spinal canal stenosis with spinal cord flattening. Bilateral neural foraminal narrowing (mild right, moderate/severe left). 4. At C4-C5, there is multifactorial severe spinal canal stenosis marked spinal cord flattening. Bilateral neural foraminal narrowing (moderate right, severe left). 5. At C5-C6, there is multifactorial severe spinal canal stenosis with fairly marked spinal cord flattening. 6. At C6-C7, there is multifactorial severe spinal canal stenosis with fairly marked spinal cord flattening. Mild/moderate left neural foraminal narrowing. 7. No more than mild spinal canal or neural foraminal stenosis at the remaining levels. 8. Multilevel disc degeneration, greatest at C4-C5 and C6-C7 (advanced at these levels). Possible early osseous fusion across the C6-C7 disc space. 9. Facet ankylosis on the left at C3-C4.  MRI L-spine 10/31/23 ([facility]/PACS): IMPRESSION: 1. 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 2. Mild bilateral foraminal narrowing at L2-3 and L3-4 is worse on the left at L3-4. 3. Mild left subarticular narrowing at L4-5. 4. Mild right subarticular and foraminal narrowing at L5-S1.  CTA CHest 08/25/23 ([facility]): IMPRESSION: 1. No evidence for pulmonary embolism or other acute intrathoracic  process.  2. Aortic atherosclerosis.   CT Neck 08/21/23 ([facility]): IMPRESSION: 1. Bilateral tonsillar inflammation. 5 mm hypoenhancing focus on the right that could represent a small peritonsillar abscess. Inflammatory edematous change in the parapharyngeal space on the right but without defined collection to suggest abscess. 2. 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 Spinal  stenosis throughout the mid and lower cervical spine that could resulting compressive myelopathy. Are there any symptoms attributable to this? Consider neurological evaluation and potential MRI electively. 3. Aortic atherosclerosis.   EKG: EKG 08/25/23 ([facility]): Sinus bradycardia at 52 bpm Inferior infarct  cited on or before 31-Jan-2022  When compared with ECG of 04-Jun-2023 15 06,  Nonspecific T wave abnormality no longer evident in anterior leads  Confirmed by Seward Speck  [phone]  on 08-26-2023 6 21 34 AM    CV: Cardiac cath 08/27/23 ([facility]): Coronary Angiography    Dominance: Right   Left Main:  The left main coronary artery (LMCA) is a large-caliber vessel  that originates from the left coronary sinus. It bifurcates into the left  anterior descending (LAD) and left circumflex (LCx) arteries. There is no  angiographic evidence of significant disease in the LMCA.   LAD: The LAD is a large-caliber vessel that gives off 3 diagonal (D)  branches before it wraps around the apex. D1 is a large-caliber vessel. D2  is a small-caliber vessel. D3 is a very small-caliber vessel.  There is  angiographic evidence of up to 20% lesion in middle LAD.   Left Circumflex: The LCx is a large-caliber vessel that gives off 1 obtuse  marginal (OM) branches and then continues as a small vessel in the AV  groove. OM1 is a large-caliber vessel.  There is angiographic evidence of  about 20% lesion in the middle left circumflex.   Right Coronary: The right coronary artery (RCA)  is a large-caliber vessel  originating from the right coronary sinus. It bifurcates distally into a  small to medium-caliber posterior descending artery (PDA) and several  posterolateral (PL) branches consistent with a right dominant system.   Patent stent in mid RCA, 20% lesion in proximal RCA, about 40- 50% lesion  in RCA continuation, which is stable from study back to 2020.   SUMMARY:  Patent stent in the mid RCA,  stable nonobstructive coronary artery  disease in proximal RCA, RCA continuation, middle LAD, mid circumflex. Normal LVEDP    RECOMMENDATIONS:  Radial access protocol  Continue aggressive secondary prevention  Continue to follow with primary cardiologist to optimize your medications    Nuclear stress test 08/26/23 ([facility]): IMPRESSION: 1. Small reversible defect in the inferior wall suspicious for  ischemia..  2. Normal left ventricular wall motion.  3. Left ventricular ejection fraction 60%  4. Non invasive risk stratification*: Low    Echo 03/12/22 ([facility]): SUMMARY  No significant change when compared to previous study 03-29-2021.  Image Quality  Good.  Technically challenging due to patient s dense breast tissue.  Normal LV size, wall thickness, wall motion and systolic function with  ejection fraction 55-60%  Normal left ventricular diastolic function and left atrial pressure.  There is mild mitral regurgitation.  There was insufficient TR detected to calculate RV systolic pressure.  Trace to mild pulmonic valvular regurgitation.    Past Medical History:  Diagnosis Date   Anginal pain (HCC)    Arthritis    Coronary artery disease    Diabetes mellitus without complication (HCC)    High cholesterol    Hypertension    Myocardial infarction (HCC)    Sleep apnea    wears CPAP nightly   Vertigo     Past Surgical History:  Procedure Laterality Date   ABDOMINAL HYSTERECTOMY     CARPAL TUNNEL RELEASE Bilateral    ELBOW SURGERY Bilateral    FOOT SURGERY Right    RIGHT AND LEFT HEART CATH  08/27/2023    MEDICATIONS:  albuterol (VENTOLIN HFA) 108 (90 Base) MCG/ACT inhaler   amLODipine (NORVASC) 5 MG tablet   aspirin EC 81 MG tablet   clindamycin (CLEOCIN T) 1 % lotion   Coenzyme Q10 (COQ10) 200 MG CAPS   Cyanocobalamin (VITAMIN B-12) 2500 MCG SUBL   empagliflozin (JARDIANCE) 25 MG TABS tablet   escitalopram (LEXAPRO) 20 MG tablet   ferrous sulfate 325 (65  FE) MG tablet   insulin glargine (LANTUS SOLOSTAR) 100 UNIT/ML Solostar Pen   metFORMIN (GLUCOPHAGE) 1000 MG tablet   metoprolol tartrate (LOPRESSOR) 25 MG tablet   Metoprolol Tartrate 75 MG TABS   Multiple Vitamins-Minerals (PRESERVISION AREDS 2 PO)   nitroGLYCERIN (NITROSTAT) 0.4 MG SL tablet   omeprazole (PRILOSEC) 40 MG capsule   pioglitazone (ACTOS) 15 MG tablet   rosuvastatin (CRESTOR) 40 MG tablet   No current facility-administered medications for this encounter.    Shonna Chock, PA-C Surgical Short Stay/Anesthesiology [facility] Phone [phone] [facility] Phone [phone] 12/13/2023 11:34 AM

## 2023-12-25 ENCOUNTER — Other Ambulatory Visit: Payer: Self-pay

## 2023-12-25 ENCOUNTER — Encounter (HOSPITAL_COMMUNITY): Payer: Self-pay | Admitting: Neurosurgery

## 2023-12-25 ENCOUNTER — Inpatient Hospital Stay (HOSPITAL_COMMUNITY)

## 2023-12-25 ENCOUNTER — Inpatient Hospital Stay (HOSPITAL_COMMUNITY): Payer: Self-pay | Admitting: Anesthesiology

## 2023-12-25 ENCOUNTER — Inpatient Hospital Stay (HOSPITAL_COMMUNITY): Payer: Self-pay | Admitting: Vascular Surgery

## 2023-12-25 ENCOUNTER — Encounter (HOSPITAL_COMMUNITY)
Admission: RE | Disposition: A | Discharge status: Home Health | Payer: Self-pay | Source: Home / Self Care | Attending: Neurosurgery

## 2023-12-25 ENCOUNTER — Inpatient Hospital Stay (HOSPITAL_COMMUNITY)
Admission: RE | Admit: 2023-12-25 | Discharge: 2023-12-27 | DRG: 472 | Disposition: A | Discharge status: Home Health | Payer: Medicare HMO | Attending: Neurosurgery | Admitting: Neurosurgery

## 2023-12-25 DIAGNOSIS — Z87891 Personal history of nicotine dependence: Secondary | ICD-10-CM | POA: Diagnosis not present

## 2023-12-25 DIAGNOSIS — G4730 Sleep apnea, unspecified: Secondary | ICD-10-CM | POA: Diagnosis present

## 2023-12-25 DIAGNOSIS — E7800 Pure hypercholesterolemia, unspecified: Secondary | ICD-10-CM | POA: Diagnosis present

## 2023-12-25 DIAGNOSIS — R2681 Unsteadiness on feet: Secondary | ICD-10-CM | POA: Diagnosis present

## 2023-12-25 DIAGNOSIS — Z7984 Long term (current) use of oral hypoglycemic drugs: Secondary | ICD-10-CM | POA: Diagnosis not present

## 2023-12-25 DIAGNOSIS — I2510 Atherosclerotic heart disease of native coronary artery without angina pectoris: Secondary | ICD-10-CM | POA: Diagnosis present

## 2023-12-25 DIAGNOSIS — R258 Other abnormal involuntary movements: Secondary | ICD-10-CM | POA: Diagnosis present

## 2023-12-25 DIAGNOSIS — I10 Essential (primary) hypertension: Secondary | ICD-10-CM | POA: Diagnosis present

## 2023-12-25 DIAGNOSIS — E119 Type 2 diabetes mellitus without complications: Secondary | ICD-10-CM | POA: Diagnosis present

## 2023-12-25 DIAGNOSIS — Z90710 Acquired absence of both cervix and uterus: Secondary | ICD-10-CM | POA: Diagnosis not present

## 2023-12-25 DIAGNOSIS — Z7982 Long term (current) use of aspirin: Secondary | ICD-10-CM | POA: Diagnosis not present

## 2023-12-25 DIAGNOSIS — M4802 Spinal stenosis, cervical region: Secondary | ICD-10-CM

## 2023-12-25 DIAGNOSIS — R292 Abnormal reflex: Secondary | ICD-10-CM | POA: Diagnosis present

## 2023-12-25 DIAGNOSIS — M488X2 Other specified spondylopathies, cervical region: Secondary | ICD-10-CM | POA: Diagnosis present

## 2023-12-25 DIAGNOSIS — M1990 Unspecified osteoarthritis, unspecified site: Secondary | ICD-10-CM | POA: Diagnosis present

## 2023-12-25 DIAGNOSIS — I252 Old myocardial infarction: Secondary | ICD-10-CM | POA: Diagnosis not present

## 2023-12-25 DIAGNOSIS — Z79899 Other long term (current) drug therapy: Secondary | ICD-10-CM

## 2023-12-25 DIAGNOSIS — M4712 Other spondylosis with myelopathy, cervical region: Secondary | ICD-10-CM

## 2023-12-25 DIAGNOSIS — R200 Anesthesia of skin: Secondary | ICD-10-CM | POA: Diagnosis present

## 2023-12-25 DIAGNOSIS — Z794 Long term (current) use of insulin: Secondary | ICD-10-CM

## 2023-12-25 DIAGNOSIS — Z888 Allergy status to other drugs, medicaments and biological substances status: Secondary | ICD-10-CM

## 2023-12-25 HISTORY — PX: POSTERIOR CERVICAL FUSION/FORAMINOTOMY: SHX5038

## 2023-12-25 LAB — HEMOGLOBIN A1C
Hgb A1c MFr Bld: 8.4 % — ABNORMAL HIGH (ref 4.8–5.6)
Mean Plasma Glucose: 194.38 mg/dL

## 2023-12-25 LAB — GLUCOSE, CAPILLARY
Glucose-Capillary: 106 mg/dL — ABNORMAL HIGH (ref 70–99)
Glucose-Capillary: 177 mg/dL — ABNORMAL HIGH (ref 70–99)
Glucose-Capillary: 167 mg/dL — ABNORMAL HIGH (ref 70–99)
Glucose-Capillary: 183 mg/dL — ABNORMAL HIGH (ref 70–99)
Glucose-Capillary: 180 mg/dL — ABNORMAL HIGH (ref 70–99)
Glucose-Capillary: 159 mg/dL — ABNORMAL HIGH (ref 70–99)

## 2023-12-25 LAB — ABO/RH: ABO/RH(D): A POS

## 2023-12-25 SURGERY — POSTERIOR CERVICAL FUSION/FORAMINOTOMY LEVEL 5
Anesthesia: General | Site: Spine Cervical

## 2023-12-25 MED ORDER — CHLORHEXIDINE GLUCONATE 0.12 % MT SOLN
15.0000 mL | Freq: Once | OROMUCOSAL | Status: AC
  Administered 2023-12-25: 15 mL via OROMUCOSAL
  Filled 2023-12-25: qty 15

## 2023-12-25 MED ORDER — ONDANSETRON HCL 4 MG/2ML IJ SOLN: 4.0000 mg | Freq: Four times a day (QID) | INTRAMUSCULAR | Status: DC | PRN

## 2023-12-25 MED ORDER — BISACODYL 10 MG RE SUPP: 10.0000 mg | Freq: Every day | RECTAL | Status: DC | PRN

## 2023-12-25 MED ORDER — ONDANSETRON HCL 4 MG/2ML IJ SOLN
INTRAMUSCULAR | Status: DC | PRN
  Administered 2023-12-25: 4 mg via INTRAVENOUS

## 2023-12-25 MED ORDER — PROPOFOL 10 MG/ML IV BOLUS
INTRAVENOUS | Status: DC | PRN
Start: 2023-12-25 — End: 2023-12-25
  Administered 2023-12-25: 50 mg via INTRAVENOUS
  Administered 2023-12-25: 150 mg via INTRAVENOUS

## 2023-12-25 MED ORDER — COQ10 200 MG PO CAPS: 200.0000 mg | ORAL_CAPSULE | Freq: Every morning | ORAL | Status: DC

## 2023-12-25 MED ORDER — AMLODIPINE BESYLATE 5 MG PO TABS
5.0000 mg | ORAL_TABLET | Freq: Every morning | ORAL | Status: DC
  Administered 2023-12-27: 5 mg via ORAL
  Filled 2023-12-25: qty 1

## 2023-12-25 MED ORDER — CHLORHEXIDINE GLUCONATE CLOTH 2 % EX PADS: 6.0000 | MEDICATED_PAD | Freq: Once | CUTANEOUS | Status: DC

## 2023-12-25 MED ORDER — LACTATED RINGERS IV SOLN: INTRAVENOUS | Status: DC

## 2023-12-25 MED ORDER — OXYCODONE HCL 5 MG PO TABS
5.0000 mg | ORAL_TABLET | ORAL | Status: DC | PRN
  Administered 2023-12-26 – 2023-12-27 (×3): 5 mg via ORAL
  Filled 2023-12-25 (×3): qty 1

## 2023-12-25 MED ORDER — ROCURONIUM BROMIDE 10 MG/ML (PF) SYRINGE
PREFILLED_SYRINGE | INTRAVENOUS | Status: DC | PRN
  Administered 2023-12-25 (×3): 10 mg via INTRAVENOUS
  Administered 2023-12-25: 70 mg via INTRAVENOUS
  Administered 2023-12-25: 10 mg via INTRAVENOUS

## 2023-12-25 MED ORDER — THROMBIN 5000 UNITS EX KIT
PACK | CUTANEOUS | Status: AC
  Filled 2023-12-25: qty 1

## 2023-12-25 MED ORDER — ESCITALOPRAM OXALATE 20 MG PO TABS
20.0000 mg | ORAL_TABLET | Freq: Every evening | ORAL | Status: DC
  Administered 2023-12-25 – 2023-12-26 (×2): 20 mg via ORAL
  Filled 2023-12-25 (×2): qty 1

## 2023-12-25 MED ORDER — DEXMEDETOMIDINE HCL IN NACL 80 MCG/20ML IV SOLN
INTRAVENOUS | Status: AC
  Filled 2023-12-25: qty 20

## 2023-12-25 MED ORDER — PROPOFOL 1000 MG/100ML IV EMUL
INTRAVENOUS | Status: AC
  Filled 2023-12-25: qty 100

## 2023-12-25 MED ORDER — BUPIVACAINE-EPINEPHRINE (PF) 0.5% -1:200000 IJ SOLN
INTRAMUSCULAR | Status: DC | PRN
  Administered 2023-12-25: 10 mL

## 2023-12-25 MED ORDER — SODIUM CHLORIDE 0.9 % IV SOLN: INTRAVENOUS | Status: DC | PRN

## 2023-12-25 MED ORDER — METOPROLOL TARTRATE 25 MG PO TABS
25.0000 mg | ORAL_TABLET | Freq: Every morning | ORAL | Status: DC
  Administered 2023-12-27: 25 mg via ORAL
  Filled 2023-12-25: qty 1

## 2023-12-25 MED ORDER — DEXAMETHASONE SODIUM PHOSPHATE 10 MG/ML IJ SOLN
INTRAMUSCULAR | Status: DC | PRN
  Administered 2023-12-25: 10 mg via INTRAVENOUS

## 2023-12-25 MED ORDER — DOCUSATE SODIUM 100 MG PO CAPS
100.0000 mg | ORAL_CAPSULE | Freq: Two times a day (BID) | ORAL | Status: DC
  Administered 2023-12-25 – 2023-12-27 (×4): 100 mg via ORAL
  Filled 2023-12-25 (×4): qty 1

## 2023-12-25 MED ORDER — BACITRACIN ZINC 500 UNIT/GM EX OINT
TOPICAL_OINTMENT | CUTANEOUS | Status: AC
  Filled 2023-12-25: qty 28.35

## 2023-12-25 MED ORDER — BUPIVACAINE LIPOSOME 1.3 % IJ SUSP
INTRAMUSCULAR | Status: AC
  Filled 2023-12-25: qty 20

## 2023-12-25 MED ORDER — ACETAMINOPHEN 10 MG/ML IV SOLN
INTRAVENOUS | Status: DC | PRN
  Administered 2023-12-25: 1000 mg via INTRAVENOUS

## 2023-12-25 MED ORDER — PHENYLEPHRINE HCL-NACL 20-0.9 MG/250ML-% IV SOLN
INTRAVENOUS | Status: DC | PRN
  Administered 2023-12-25: 40 ug/min via INTRAVENOUS

## 2023-12-25 MED ORDER — ALBUMIN HUMAN 5 % IV SOLN: INTRAVENOUS | Status: DC | PRN

## 2023-12-25 MED ORDER — METFORMIN HCL 500 MG PO TABS
1000.0000 mg | ORAL_TABLET | Freq: Two times a day (BID) | ORAL | Status: DC
  Administered 2023-12-25 – 2023-12-27 (×4): 1000 mg via ORAL
  Filled 2023-12-25 (×4): qty 2

## 2023-12-25 MED ORDER — ZOLPIDEM TARTRATE 5 MG PO TABS: 5.0000 mg | ORAL_TABLET | Freq: Every evening | ORAL | Status: DC | PRN

## 2023-12-25 MED ORDER — FERROUS SULFATE 325 (65 FE) MG PO TABS
325.0000 mg | ORAL_TABLET | ORAL | Status: DC
  Administered 2023-12-25: 325 mg via ORAL
  Filled 2023-12-25: qty 1

## 2023-12-25 MED ORDER — METOPROLOL TARTRATE 25 MG PO TABS
75.0000 mg | ORAL_TABLET | Freq: Two times a day (BID) | ORAL | Status: DC
  Administered 2023-12-25 – 2023-12-26 (×2): 75 mg via ORAL
  Filled 2023-12-25 (×2): qty 3

## 2023-12-25 MED ORDER — METOPROLOL TARTRATE 50 MG PO TABS
50.0000 mg | ORAL_TABLET | Freq: Once | ORAL | Status: AC
  Administered 2023-12-25: 50 mg via ORAL
  Filled 2023-12-25: qty 1

## 2023-12-25 MED ORDER — BUPIVACAINE-EPINEPHRINE (PF) 0.5% -1:200000 IJ SOLN
INTRAMUSCULAR | Status: AC
  Filled 2023-12-25: qty 30

## 2023-12-25 MED ORDER — ALBUTEROL SULFATE HFA 108 (90 BASE) MCG/ACT IN AERS: 1.0000 | INHALATION_SPRAY | Freq: Four times a day (QID) | RESPIRATORY_TRACT | Status: DC | PRN

## 2023-12-25 MED ORDER — ORAL CARE MOUTH RINSE: 15.0000 mL | Freq: Once | OROMUCOSAL | Status: AC

## 2023-12-25 MED ORDER — DEXMEDETOMIDINE HCL IN NACL 80 MCG/20ML IV SOLN
INTRAVENOUS | Status: DC | PRN
  Administered 2023-12-25 (×2): 15 ug via INTRAVENOUS

## 2023-12-25 MED ORDER — MIDAZOLAM HCL 2 MG/2ML IJ SOLN
INTRAMUSCULAR | Status: DC | PRN
  Administered 2023-12-25: 2 mg via INTRAVENOUS

## 2023-12-25 MED ORDER — DEXAMETHASONE SODIUM PHOSPHATE 10 MG/ML IJ SOLN
INTRAMUSCULAR | Status: AC
  Filled 2023-12-25: qty 1

## 2023-12-25 MED ORDER — LIDOCAINE 2% (20 MG/ML) 5 ML SYRINGE
INTRAMUSCULAR | Status: AC
  Filled 2023-12-25: qty 5

## 2023-12-25 MED ORDER — INSULIN ASPART 100 UNIT/ML IJ SOLN: INTRAMUSCULAR | Status: DC | PRN

## 2023-12-25 MED ORDER — INSULIN ASPART 100 UNIT/ML IJ SOLN
0.0000 [IU] | Freq: Three times a day (TID) | INTRAMUSCULAR | Status: DC
  Administered 2023-12-25: 4 [IU] via SUBCUTANEOUS
  Administered 2023-12-26 – 2023-12-27 (×3): 3 [IU] via SUBCUTANEOUS

## 2023-12-25 MED ORDER — CEFAZOLIN SODIUM-DEXTROSE 2-3 GM-%(50ML) IV SOLR
INTRAVENOUS | Status: DC | PRN
Start: 2023-12-25 — End: 2023-12-25
  Administered 2023-12-25 (×2): 2 g via INTRAVENOUS

## 2023-12-25 MED ORDER — OXYCODONE HCL 5 MG/5ML PO SOLN: 5.0000 mg | Freq: Once | ORAL | Status: DC | PRN

## 2023-12-25 MED ORDER — ALUM & MAG HYDROXIDE-SIMETH 200-200-20 MG/5ML PO SUSP: 30.0000 mL | Freq: Four times a day (QID) | ORAL | Status: DC | PRN

## 2023-12-25 MED ORDER — CEFAZOLIN SODIUM 1 G IJ SOLR
INTRAMUSCULAR | Status: AC
  Filled 2023-12-25: qty 20

## 2023-12-25 MED ORDER — EPHEDRINE 5 MG/ML INJ
INTRAVENOUS | Status: AC
  Filled 2023-12-25: qty 5

## 2023-12-25 MED ORDER — PIOGLITAZONE HCL 15 MG PO TABS
15.0000 mg | ORAL_TABLET | Freq: Every morning | ORAL | Status: DC
  Administered 2023-12-26 – 2023-12-27 (×2): 15 mg via ORAL
  Filled 2023-12-25 (×2): qty 1

## 2023-12-25 MED ORDER — ROCURONIUM BROMIDE 10 MG/ML (PF) SYRINGE
PREFILLED_SYRINGE | INTRAVENOUS | Status: AC
  Filled 2023-12-25: qty 10

## 2023-12-25 MED ORDER — ACETAMINOPHEN 650 MG RE SUPP: 650.0000 mg | RECTAL | Status: DC | PRN

## 2023-12-25 MED ORDER — ACETAMINOPHEN 500 MG PO TABS
1000.0000 mg | ORAL_TABLET | Freq: Four times a day (QID) | ORAL | Status: AC
  Administered 2023-12-25 – 2023-12-26 (×4): 1000 mg via ORAL
  Filled 2023-12-25 (×4): qty 2

## 2023-12-25 MED ORDER — CEFAZOLIN SODIUM-DEXTROSE 2-4 GM/100ML-% IV SOLN
2.0000 g | INTRAVENOUS | Status: DC
  Filled 2023-12-25: qty 100

## 2023-12-25 MED ORDER — PHENYLEPHRINE 80 MCG/ML (10ML) SYRINGE FOR IV PUSH (FOR BLOOD PRESSURE SUPPORT)
PREFILLED_SYRINGE | INTRAVENOUS | Status: DC | PRN
  Administered 2023-12-25: 40 ug via INTRAVENOUS
  Administered 2023-12-25: 80 ug via INTRAVENOUS

## 2023-12-25 MED ORDER — LACTATED RINGERS IV SOLN: INTRAVENOUS | Status: DC | PRN

## 2023-12-25 MED ORDER — CYCLOBENZAPRINE HCL 10 MG PO TABS
10.0000 mg | ORAL_TABLET | Freq: Three times a day (TID) | ORAL | Status: DC | PRN
  Administered 2023-12-25: 10 mg via ORAL
  Filled 2023-12-25: qty 1

## 2023-12-25 MED ORDER — MIDAZOLAM HCL 2 MG/2ML IJ SOLN
INTRAMUSCULAR | Status: AC
  Filled 2023-12-25: qty 2

## 2023-12-25 MED ORDER — BUPIVACAINE LIPOSOME 1.3 % IJ SUSP
INTRAMUSCULAR | Status: DC | PRN
  Administered 2023-12-25: 20 mL

## 2023-12-25 MED ORDER — ONDANSETRON HCL 4 MG/2ML IJ SOLN: 4.0000 mg | Freq: Once | INTRAMUSCULAR | Status: DC | PRN

## 2023-12-25 MED ORDER — BACITRACIN ZINC 500 UNIT/GM EX OINT
TOPICAL_OINTMENT | CUTANEOUS | Status: DC | PRN
  Administered 2023-12-25: 1 via TOPICAL

## 2023-12-25 MED ORDER — VASOPRESSIN 20 UNIT/ML IV SOLN
INTRAVENOUS | Status: AC
  Filled 2023-12-25: qty 1

## 2023-12-25 MED ORDER — LIDOCAINE 2% (20 MG/ML) 5 ML SYRINGE
INTRAMUSCULAR | Status: DC | PRN
  Administered 2023-12-25: 80 mg via INTRAVENOUS

## 2023-12-25 MED ORDER — EMPAGLIFLOZIN 25 MG PO TABS
25.0000 mg | ORAL_TABLET | Freq: Every morning | ORAL | Status: DC
  Administered 2023-12-26 – 2023-12-27 (×2): 25 mg via ORAL
  Filled 2023-12-25 (×2): qty 1

## 2023-12-25 MED ORDER — PANTOPRAZOLE SODIUM 40 MG PO TBEC
80.0000 mg | DELAYED_RELEASE_TABLET | Freq: Every day | ORAL | Status: DC
  Administered 2023-12-25 – 2023-12-27 (×3): 80 mg via ORAL
  Filled 2023-12-25 (×3): qty 2

## 2023-12-25 MED ORDER — MENTHOL 3 MG MT LOZG: 1.0000 | LOZENGE | OROMUCOSAL | Status: DC | PRN

## 2023-12-25 MED ORDER — ROSUVASTATIN CALCIUM 20 MG PO TABS
40.0000 mg | ORAL_TABLET | Freq: Every evening | ORAL | Status: DC
  Administered 2023-12-25 – 2023-12-26 (×2): 40 mg via ORAL
  Filled 2023-12-25 (×2): qty 2

## 2023-12-25 MED ORDER — FENTANYL CITRATE (PF) 250 MCG/5ML IJ SOLN
INTRAMUSCULAR | Status: DC | PRN
  Administered 2023-12-25 (×5): 50 ug via INTRAVENOUS

## 2023-12-25 MED ORDER — INSULIN ASPART 100 UNIT/ML IJ SOLN
INTRAMUSCULAR | Status: DC | PRN
  Administered 2023-12-25: 4 [IU] via SUBCUTANEOUS
  Administered 2023-12-25: 5 [IU] via SUBCUTANEOUS

## 2023-12-25 MED ORDER — FENTANYL CITRATE (PF) 250 MCG/5ML IJ SOLN
INTRAMUSCULAR | Status: AC
  Filled 2023-12-25: qty 5

## 2023-12-25 MED ORDER — 0.9 % SODIUM CHLORIDE (POUR BTL) OPTIME
TOPICAL | Status: DC | PRN
  Administered 2023-12-25 (×2): 1000 mL

## 2023-12-25 MED ORDER — OXYCODONE HCL 5 MG PO TABS: 5.0000 mg | ORAL_TABLET | Freq: Once | ORAL | Status: DC | PRN

## 2023-12-25 MED ORDER — MORPHINE SULFATE (PF) 2 MG/ML IV SOLN
2.0000 mg | INTRAVENOUS | Status: DC | PRN
Start: 2023-12-25 — End: 2023-12-27
  Administered 2023-12-25: 4 mg via INTRAVENOUS
  Filled 2023-12-25: qty 2

## 2023-12-25 MED ORDER — ONDANSETRON HCL 4 MG PO TABS: 4.0000 mg | ORAL_TABLET | Freq: Four times a day (QID) | ORAL | Status: DC | PRN

## 2023-12-25 MED ORDER — VASHE WOUND IRRIGATION OPTIME
TOPICAL | Status: DC | PRN
  Administered 2023-12-25: 34 [oz_av]

## 2023-12-25 MED ORDER — OXYCODONE HCL 5 MG PO TABS
10.0000 mg | ORAL_TABLET | ORAL | Status: DC | PRN
  Administered 2023-12-25 – 2023-12-26 (×3): 10 mg via ORAL
  Filled 2023-12-25 (×3): qty 2

## 2023-12-25 MED ORDER — FENTANYL CITRATE (PF) 100 MCG/2ML IJ SOLN: 25.0000 ug | INTRAMUSCULAR | Status: DC | PRN

## 2023-12-25 MED ORDER — ACETAMINOPHEN 10 MG/ML IV SOLN
INTRAVENOUS | Status: AC
  Filled 2023-12-25: qty 100

## 2023-12-25 MED ORDER — THROMBIN 5000 UNITS EX SOLR
OROMUCOSAL | Status: DC | PRN
  Administered 2023-12-25 (×3): 5 mL via TOPICAL

## 2023-12-25 MED ORDER — CEFAZOLIN SODIUM-DEXTROSE 2-4 GM/100ML-% IV SOLN
2.0000 g | Freq: Three times a day (TID) | INTRAVENOUS | Status: AC
  Administered 2023-12-25 – 2023-12-26 (×2): 2 g via INTRAVENOUS
  Filled 2023-12-25 (×2): qty 100

## 2023-12-25 MED ORDER — PHENYLEPHRINE 80 MCG/ML (10ML) SYRINGE FOR IV PUSH (FOR BLOOD PRESSURE SUPPORT)
PREFILLED_SYRINGE | INTRAVENOUS | Status: AC
  Filled 2023-12-25: qty 10

## 2023-12-25 MED ORDER — ACETAMINOPHEN 325 MG PO TABS: 650.0000 mg | ORAL_TABLET | ORAL | Status: DC | PRN

## 2023-12-25 MED ORDER — PHENOL 1.4 % MT LIQD: 1.0000 | OROMUCOSAL | Status: DC | PRN

## 2023-12-25 SURGICAL SUPPLY — 68 items
BAG COUNTER SPONGE SURGICOUNT (BAG) ×2 IMPLANT
BAND RUBBER #18 3X1/16 STRL (MISCELLANEOUS) ×4 IMPLANT
BASKET BONE COLLECTION (BASKET) IMPLANT
BENZOIN TINCTURE PRP APPL 2/3 (GAUZE/BANDAGES/DRESSINGS) ×2 IMPLANT
BIT DRILL NEURO 2X3.1 SFT TUCH (MISCELLANEOUS) ×2 IMPLANT
BLADE CLIPPER SURG (BLADE) ×2 IMPLANT
BLADE SURG 11 STRL SS (BLADE) IMPLANT
BLADE ULTRA TIP 2M (BLADE) IMPLANT
BUR 15 MATCH 2.2 (BUR) IMPLANT
BUR PRECISION FLUTE 6.0 (BURR) IMPLANT
BURR 15 MATCH 2.2 (BUR) ×2 IMPLANT
CANISTER SUCT 3000ML PPV (MISCELLANEOUS) ×2 IMPLANT
CAP CLSR POST CERV (Cap) IMPLANT
CLEANSER WND VASHE 34 (WOUND CARE) IMPLANT
CLSR STERI-STRIP ANTIMIC 1/2X4 (GAUZE/BANDAGES/DRESSINGS) IMPLANT
COVERAGE SUPPORT O-ARM STEALTH (MISCELLANEOUS) ×2 IMPLANT
DRAPE C-ARM 42X72 X-RAY (DRAPES) ×4 IMPLANT
DRAPE LAPAROTOMY 100X72 PEDS (DRAPES) ×2 IMPLANT
DRAPE MICROSCOPE SLANT 54X150 (MISCELLANEOUS) IMPLANT
DRAPE SHEET LG 3/4 BI-LAMINATE (DRAPES) ×8 IMPLANT
DRAPE SURG 17X23 STRL (DRAPES) ×4 IMPLANT
DRILL NEURO 2X3.1 SOFT TOUCH (MISCELLANEOUS) ×2 IMPLANT
DRSG OPSITE POSTOP 4X8 (GAUZE/BANDAGES/DRESSINGS) IMPLANT
DRSG TEGADERM 4X4.5 CHG (GAUZE/BANDAGES/DRESSINGS) IMPLANT
ELECT BLADE 4.0 EZ CLEAN MEGAD (MISCELLANEOUS) ×2 IMPLANT
ELECT REM PT RETURN 9FT ADLT (ELECTROSURGICAL) ×2 IMPLANT
ELECTRODE BLDE 4.0 EZ CLN MEGD (MISCELLANEOUS) ×2 IMPLANT
ELECTRODE REM PT RTRN 9FT ADLT (ELECTROSURGICAL) ×2 IMPLANT
EVACUATOR 1/8 PVC DRAIN (DRAIN) IMPLANT
FEE COVERAGE SUPPORT O-ARM (MISCELLANEOUS) ×2 IMPLANT
GAUZE 4X4 16PLY ~~LOC~~+RFID DBL (SPONGE) IMPLANT
GAUZE SPONGE 4X4 12PLY STRL (GAUZE/BANDAGES/DRESSINGS) IMPLANT
GLOVE BIO SURGEON STRL SZ8 (GLOVE) ×4 IMPLANT
GLOVE BIO SURGEON STRL SZ8.5 (GLOVE) ×4 IMPLANT
GLOVE EXAM NITRILE XL STR (GLOVE) IMPLANT
GOWN STRL REUS W/ TWL LRG LVL3 (GOWN DISPOSABLE) IMPLANT
GOWN STRL REUS W/ TWL XL LVL3 (GOWN DISPOSABLE) ×2 IMPLANT
GOWN STRL REUS W/TWL 2XL LVL3 (GOWN DISPOSABLE) IMPLANT
HEMOSTAT POWDER KIT SURGIFOAM (HEMOSTASIS) ×2 IMPLANT
KIT BASIN OR (CUSTOM PROCEDURE TRAY) ×2 IMPLANT
KIT TURNOVER KIT B (KITS) ×2 IMPLANT
MARKER SPHERE PSV REFLC NDI (MISCELLANEOUS) ×10 IMPLANT
NDL HYPO 22X1.5 SAFETY MO (MISCELLANEOUS) ×2 IMPLANT
NDL SPNL 18GX3.5 QUINCKE PK (NEEDLE) IMPLANT
NEEDLE HYPO 22X1.5 SAFETY MO (MISCELLANEOUS) ×2 IMPLANT
NEEDLE SPNL 18GX3.5 QUINCKE PK (NEEDLE) IMPLANT
NS IRRIG 1000ML POUR BTL (IV SOLUTION) ×2 IMPLANT
PACK LAMINECTOMY NEURO (CUSTOM PROCEDURE TRAY) ×2 IMPLANT
PAD ARMBOARD POSITIONER FOAM (MISCELLANEOUS) ×6 IMPLANT
PATTIES SURGICAL .25X.25 (GAUZE/BANDAGES/DRESSINGS) IMPLANT
PIN MAYFIELD SKULL DISP (PIN) ×2 IMPLANT
PUTTY DBM 5CC CALC GRAN (Putty) IMPLANT
ROD VIRAGE BLUE CRVD 3.5X90MM (Rod) IMPLANT
SCREW PA CT VIRAGE 4.5X25 (Screw) IMPLANT
SCREW VIRAGE 3.5X14 (Screw) IMPLANT
SCREW VIRAGE POLY 3.5X18 (Screw) IMPLANT
SPIKE FLUID TRANSFER (MISCELLANEOUS) ×2 IMPLANT
SPONGE NEURO XRAY DETECT 1X3 (DISPOSABLE) IMPLANT
SPONGE SURGIFOAM ABS GEL 100 (HEMOSTASIS) IMPLANT
SPONGE T-LAP 4X18 ~~LOC~~+RFID (SPONGE) IMPLANT
STAPLER VISISTAT (STAPLE) IMPLANT
STRIP CLOSURE SKIN 1/2X4 (GAUZE/BANDAGES/DRESSINGS) ×2 IMPLANT
SUT VIC AB 0 CT1 18XCR BRD8 (SUTURE) ×2 IMPLANT
SUT VIC AB 2-0 CP2 18 (SUTURE) ×2 IMPLANT
TOWEL GREEN STERILE (TOWEL DISPOSABLE) ×2 IMPLANT
TOWEL GREEN STERILE FF (TOWEL DISPOSABLE) ×2 IMPLANT
TRAY FOLEY MTR SLVR 16FR STAT (SET/KITS/TRAYS/PACK) IMPLANT
WATER STERILE IRR 1000ML POUR (IV SOLUTION) ×2 IMPLANT

## 2023-12-25 NOTE — Progress Notes (Signed)
 Orthopedic Tech Progress Note Patient Details:  Tamara Copeland [birth year] [id number]  Patient has collar   Patient ID: Tamara Copeland, female   DOB: 1960/11/06, 63 y.o.   MRN: [id number]  Tamara Copeland 12/25/2023, 3:42 PM

## 2023-12-25 NOTE — Anesthesia Postprocedure Evaluation (Signed)
 Anesthesia Post Note  Patient: [name]  Procedure(s) Performed: POSTERIOR CERVICAL LAMINECTOMY, CERVICAL TWENTY-THREE,CERVICAL THIRTY-FOUR,CERVICAL FOURTY-FIVE,CERVICAL FIFTY-SIX,CERVICAL SISXTY-SEVEN; POSTERIOR INSTRUMENTATION AND FUSION CERVICAL TWO-THORACIC ONE APPLICATION OF O-ARM     Patient location during evaluation: PACU Anesthesia Type: General Level of consciousness: awake and alert Pain management: pain level controlled Vital Signs Assessment: post-procedure vital signs reviewed and stable Respiratory status: spontaneous breathing, nonlabored ventilation, respiratory function stable and patient connected to nasal cannula oxygen Cardiovascular status: blood pressure returned to baseline and stable Postop Assessment: no apparent nausea or vomiting Anesthetic complications: no   No notable events documented.  Last Vitals:  Vitals:   12/25/23 1345 12/25/23 1400  BP: 133/71 132/72  Pulse: 77 76  Resp: (!) 21 (!) 22  Temp:    SpO2: 94% 94%    Last Pain:  Vitals:   12/25/23 1400  PainSc: Asleep                 Tamara Copeland

## 2023-12-25 NOTE — H&P (Signed)
 Subjective: The patient is a 63 year old black female who presented with bilateral arm pain numbness tingling and weakness, unsteady gait, etc. consistent with a cervical myelopathy.  She was worked up with a cervical MRI which demonstrated multilevel stenosis and spinal cord signal change.  I discussed the various treatment options with her.  She has decided to proceed with surgery.  Past Medical History:  Diagnosis Date   Anginal pain (HCC)    Arthritis    Coronary artery disease    Diabetes mellitus without complication (HCC)    High cholesterol    Hypertension    Myocardial infarction (HCC)    Sleep apnea    wears CPAP nightly   Vertigo     Past Surgical History:  Procedure Laterality Date   ABDOMINAL HYSTERECTOMY     CARPAL TUNNEL RELEASE Bilateral    ELBOW SURGERY Bilateral    FOOT SURGERY Right    RIGHT AND LEFT HEART CATH  08/27/2023    Allergies  Allergen Reactions   Lisinopril Swelling    Lip swelling    Social History   Tobacco Use   Smoking status: Former    Current packs/day: 0.00    Types: Cigarettes    Quit date: 2017    Years since quitting: 8.2   Smokeless tobacco: Never  Substance Use Topics   Alcohol use: No    History reviewed. No pertinent family history. Prior to Admission medications   Medication Sig Start Date End Date Taking? Authorizing Provider  amLODipine (NORVASC) 5 MG tablet Take 5 mg by mouth in the morning.   Yes [provider]  aspirin EC 81 MG tablet Take 81 mg by mouth every evening. Swallow whole.   Yes [provider]  clindamycin (CLEOCIN T) 1 % lotion Apply 1 Application topically 2 (two) times daily as needed (skin irritation.).   Yes [provider]  Coenzyme Q10 (COQ10) 200 MG CAPS Take 200 mg by mouth in the morning.   Yes [provider]  Cyanocobalamin (VITAMIN B-12) 2500 MCG SUBL Take 2,500 mcg by mouth in the morning.   Yes [provider]  empagliflozin (JARDIANCE) 25 MG TABS  tablet Take 25 mg by mouth in the morning.   Yes [provider]  escitalopram (LEXAPRO) 20 MG tablet Take 20 mg by mouth every evening.   Yes [provider]  ferrous sulfate 325 (65 FE) MG tablet Take 325 mg by mouth every Monday, Wednesday, and Friday at 8 PM. In the evening.   Yes [provider]  insulin glargine (LANTUS SOLOSTAR) 100 UNIT/ML Solostar Pen Inject 10 Units into the skin every evening.   Yes [provider]  metFORMIN (GLUCOPHAGE) 1000 MG tablet Take 1,000 mg by mouth in the morning and at bedtime.   Yes [provider]  metoprolol tartrate (LOPRESSOR) 25 MG tablet Take 25 mg by mouth in the morning.   Yes [provider]  Metoprolol Tartrate 75 MG TABS Take 75 mg by mouth in the morning and at bedtime.   Yes [provider]  Multiple Vitamins-Minerals (PRESERVISION AREDS 2 PO) Take 1 tablet by mouth in the morning and at bedtime.   Yes [provider]  nitroGLYCERIN (NITROSTAT) 0.4 MG SL tablet Place 0.4 mg under the tongue every 5 (five) minutes x 3 doses as needed for chest pain.   Yes [provider]  omeprazole (PRILOSEC) 40 MG capsule Take 40 mg by mouth every evening.   Yes [provider]  pioglitazone (ACTOS) 15 MG tablet Take 15 mg by mouth in the morning.   Yes [provider]  rosuvastatin (CRESTOR) 40 MG tablet Take 40 mg by mouth every evening.   Yes [provider]  albuterol (VENTOLIN HFA) 108 (90 Base) MCG/ACT inhaler Inhale 1-2 puffs into the lungs every 6 (six) hours as needed for wheezing or shortness of breath.    [provider]     Review of Systems  Positive ROS: As above  All other systems have been reviewed and were otherwise negative with the exception of those mentioned in the HPI and as above.  Objective: Vital signs in last 24 hours: Temp:  [97.4 F (36.3 C)] 97.4 F (36.3 C) (03/26 0554) Pulse Rate:  [54-56] 56 (03/26  0705) Resp:  [18] 18 (03/26 0554) BP: (128)/(68) 128/68 (03/26 0554) SpO2:  [99 %] 99 % (03/26 0554) Weight:  [85.3 kg] 85.3 kg (03/26 0554) Estimated body mass index is 33.3 kg/m as calculated from the following:   Height as of this encounter: 5\' 3"  (1.6 m).   Weight as of this encounter: 85.3 kg.   General Appearance: Alert Head: Normocephalic, without obvious abnormality, atraumatic Eyes: PERRL, conjunctiva/corneas clear, EOM's intact,    Ears: Normal  Throat: Normal  Neck: The patient has a very limited cervical range of motion. Back: unremarkable Lungs: Clear to auscultation bilaterally, respirations unlabored Heart: Regular rate and rhythm, no murmur, rub or gallop Abdomen: Soft, non-tender Extremities: Extremities normal, atraumatic, no cyanosis or edema Skin: unremarkable  NEUROLOGIC:   Mental status: alert and oriented,Motor Exam -has bilateral hand weakness. Sensory Exam -she has numbness in her hands. Reflexes: She is hyperreflexic in her lower extremities.  She has bilateral ankle clonus. Coordination - grossly normal Gait -her gait is unsteady. Balance - grossly normal Cranial Nerves: I: smell Not tested  II: visual acuity  OS: Normal  OD: Normal   II: visual fields Full to confrontation  II: pupils Equal, round, reactive to light  III,VII: ptosis None  III,IV,VI: extraocular muscles  Full ROM  V: mastication Normal  V: facial light touch sensation  Normal  V,VII: corneal reflex  Present  VII: facial muscle function - upper  Normal  VII: facial muscle function - lower Normal  VIII: hearing Not tested  IX: soft palate elevation  Normal  IX,X: gag reflex Present  XI: trapezius strength  5/5  XI: sternocleidomastoid strength 5/5  XI: neck flexion strength  5/5  XII: tongue strength  Normal    Data Review Lab Results  Component Value Date   WBC 7.6 12/11/2023   HGB 13.1 12/11/2023   HCT 41.9 12/11/2023   MCV 85.3 12/11/2023   PLT 274 12/11/2023    Lab Results  Component Value Date   NA 138 12/11/2023   K 4.3 12/11/2023   CL 104 12/11/2023   CO2 27 12/11/2023   BUN 14 12/11/2023   CREATININE 1.30 (H) 12/11/2023   GLUCOSE 150 (H) 12/11/2023   No results found for: "INR", "PROTIME"  Assessment/Plan: With stenosis, cervical myelopathy: I have discussed the situation with the patient and her husband.  I reviewed her MRI scan with him and pointed out the abnormalities.  We have discussed the various treatment options including surgery.  I have recommended the surgical treatment option of a posterior cervical instrumentation fusion from C2-T2.  I described that surgery to them.  I have shown him surgical models.  I have given him a surgical pamphlet.  We  had discussed the risk, benefits, alternatives, expected postop course, and likelihood of achieving our goals with surgery.  I have answered all their questions.  She has decided proceed with surgery.   Cristi Loron 12/25/2023 7:25 AM

## 2023-12-25 NOTE — Anesthesia Procedure Notes (Signed)
 Procedure Name: Intubation Date/Time: 12/25/2023 7:42 AM  Performed by: Herbie Drape, CRNAPre-anesthesia Checklist: Patient identified, Emergency Drugs available, Suction available, Patient being monitored and Timeout performed Patient Re-evaluated:Patient Re-evaluated prior to induction Oxygen Delivery Method: Circle system utilized Preoxygenation: Pre-oxygenation with 100% oxygen Induction Type: IV induction Ventilation: Mask ventilation without difficulty Laryngoscope Size: Glidescope and 4 Grade View: Grade I Tube type: Oral Tube size: 7.0 mm Number of attempts: 1 Placement Confirmation: ETT inserted through vocal cords under direct vision, positive ETCO2, breath sounds checked- equal and bilateral and CO2 detector Secured at: 22 (at the gums/ soft airway placed) cm Tube secured with: Tape Dental Injury: Teeth and Oropharynx as per pre-operative assessment

## 2023-12-25 NOTE — Op Note (Signed)
 Brief history: The patient is a 63 year old black female who presented with neck pain with bilateral arm and hand numbness tingling weakness, unsteady gait, hyperreflexia, clonus etc. consistent with a cervical myelopathy.  She was worked up with a cervical MRI which demonstrated multilevel cervical spinal stenosis.  We discussed the various treatment options.  I recommended a cervical decompression, instrumentation and fusion.  She has decided to proceed with surgery.  Preop diagnosis: Ossification of the posterior longitudinal ligament, cervical spondylosis, cervical stenosis, cervical myelopathy, cervicalgia  Postop diagnosis: The same  Procedure: C2-3, C3-4, C4-5, C5-6, C6-7 and C7-T1 decompressive laminectomy; posterior cervical instrumentation C2-T1 with Zimmer titanium screws and rods; C2-3, C3-4, C4-5, C5-6, C6-7 and C7-T1 posterior arthrodesis with local morselized autograft bone and intra grow bone graft extender; application of Stealth spinal navigation  Surgeon: Dr. Delma Officer  Assistant: Hildred Priest, NP  Anesthesia: General Tracheal  Estimated blood loss: 200 cc  Specimens: None  Drains: 1 medium Hemovac drain in the epidural space  Complications: None  Description of procedure: The patient was brought to the operating room by the anesthesia team.  General endotracheal anesthesia was induced.  I applied the Mayfield 3 point headrest to the patient's calvarium.  The patient was carefully turned to the prone position on the chest rolls keeping her neck in a neutral position.  The patient's suboccipital region was then shaved with a clippers.  The patient's suboccipital posterior cervical and upper thoracic region was then prepared with Betadine scrub and Betadine solution.  Sterile drapes were applied.  I then injected the area to be incised with Marcaine with epinephrine solution.  I then used a scalpel to make a linear midline incision from proximately C2-T1.  I used  electrocautery to perform a bilateral subperiosteal dissection exposing the spinous process lamina and facets from C2-T1.  We obtained an intraoperative radiograph to confirm our location.  We used cerebellar retractors for exposure.  I began the decompression by removing the interspinous ligament at C2-3, C3-4, C4-5, C5-6, C6-7 and C7-T1 with the Leksell rongeur's electrocautery.  I used a high-speed drill to remove the soft tissue from the bilateral C3, C4, C5, C6 and C7 lamina.  I then used high-speed drill to drill away the remainder of the spinous process at C3, C4, C5-6 and C7 and to thin out lamina at C3, C4, C5, C6 and C7.  I then used the 2 mm Kerrison punch to carefully remove the ligamentum flavum and the remainder of the lamina at C2-3, C3-4, C4-5, C5-6 and C6-7.  I undercut the lamina at C2 and T1 further decompressing the thecal sac.  Having completed the decompression we now turned our attention to the instrumentation.  We applied the reference array to the patient's T1 spinous process.  We then obtained an intraoperative CT scan with the O-arm.  Under spinal navigation I drilled pilot holes at C2, C3, C4, C5, C6, C7 and T1 bilaterally.  I drilled pilot holes with the drill at C2, C3, C4, C5, C6-C7 and T1 bilaterally under spinal navigation and using anatomic landmarks.  We removed the drill and probe inside the drill holes and rule out cortical breaches.  We then tapped the holes and inserted 3.5 x 18 mm pedicle screws into the pars bilaterally at C2, 3.5 x 14 mm lateral mass screws bilaterally at C3, C4, C5, C6 and C7, and a 4.5 x 25 mm pedicle screw bilaterally at T1.  We got good bony purchase with the screw.  We  then connected the unilateral screws with a lordotic rod which we bent appropriately.  We secured the rod in place with the caps were tightened appropriately which completed the instrumentation from C2-T1 bilaterally.  We now turned our attention to the arthrodesis.  I used the  drill to decorticate the remainder of the lateral masses and facets at C2-3, C3-4, C4-5, C5-6, C6-7 and C7-T1.  We then laid a combination of local morselized autograft bone we had obtained during the decompression and Actifusebone graft extender over these decorticated structures completing posterolateral arthrodesis at C2-3, C3-4, C4-5, C5-6, C6-7 and C7-T1.  We then obtained hemostasis with bipolar cautery.  We irrigated the wound out with Vashe solution.  We placed a medium Hemovac drain in the epidural space and tunneled it out through a separate stab wound.  We removed the retractors.  We injected Exparel.  We then reapproximated the patient's cervical thoracic fascia with interrupted 0 Vicryl suture.  We reapproximated the patient's subcutaneous tissue with a 2-0 Vicryl suture.  We reapproximated the skin with Steri-Strips and benzoin.  The wound was then coated with bacitracin ointment.  A sterile dressing was applied.  The drapes were removed.  The patient was then returned to the supine position.  I then removed the Mayfield 3 point headrest from calvarium.  By report all sponge, instrument, and needle counts were correct at the end of this case.

## 2023-12-25 NOTE — Transfer of Care (Signed)
 Immediate Anesthesia Transfer of Care Note  Patient: Tamara Copeland  Procedure(s) Performed: POSTERIOR CERVICAL LAMINECTOMY, CERVICAL TWENTY-THREE,CERVICAL THIRTY-FOUR,CERVICAL FOURTY-FIVE,CERVICAL FIFTY-SIX,CERVICAL SISXTY-SEVEN; POSTERIOR INSTRUMENTATION AND FUSION CERVICAL TWO-THORACIC ONE APPLICATION OF O-ARM  Patient Location: PACU  Anesthesia Type:General  Level of Consciousness: awake  Airway & Oxygen Therapy: Patient Spontanous Breathing  Post-op Assessment: Report given to RN  Post vital signs: Reviewed  Last Vitals:  Vitals Value Taken Time  BP 138/100 12/25/23 1330  Temp 98.7   Pulse 77 12/25/23 1337  Resp 23 12/25/23 1337  SpO2 91 % 12/25/23 1337  Vitals shown include unfiled device data.  Last Pain:  Vitals:   12/25/23 0615  PainSc: 0-No pain      Patients Stated Pain Goal: 0 (12/25/23 0615)  Complications: No notable events documented.

## 2023-12-26 ENCOUNTER — Encounter (HOSPITAL_COMMUNITY): Payer: Self-pay | Admitting: Neurosurgery

## 2023-12-26 LAB — GLUCOSE, CAPILLARY
Glucose-Capillary: 130 mg/dL — ABNORMAL HIGH (ref 70–99)
Glucose-Capillary: 117 mg/dL — ABNORMAL HIGH (ref 70–99)
Glucose-Capillary: 141 mg/dL — ABNORMAL HIGH (ref 70–99)
Glucose-Capillary: 123 mg/dL — ABNORMAL HIGH (ref 70–99)

## 2023-12-26 MED FILL — Thrombin For Soln 5000 Unit: CUTANEOUS | Qty: 5000 | Status: AC

## 2023-12-26 NOTE — Progress Notes (Signed)
   Providing Compassionate, Quality Care - Together   Subjective: Patient reports posterior cervical pain.  Objective: Vital signs in last 24 hours: Temp:  [98.4 F (36.9 C)-99.4 F (37.4 C)] 98.7 F (37.1 C) (03/27 0837) Pulse Rate:  [65-80] 65 (03/27 0837) Resp:  [17-22] 17 (03/27 0837) BP: (101-138)/(60-100) 119/66 (03/27 0837) SpO2:  [90 %-100 %] 95 % (03/27 0837)  Intake/Output from previous day: 03/26 0701 - 03/27 0700 In: 2090 [P.O.:240; I.V.:1600; IV Piggyback:250] Out: 985 [Urine:575; Drains:160; Blood:250] Intake/Output this shift: Total I/O In: 240 [P.O.:240] Out: -   Alert and oriented x 4 PERRLA CN II-XII grossly intact MAE, Generalized weakness Incision is covered with Honeycomb dressing and Steri Strips; Dressing is clean, dry, and intact   Lab Results: No results for input(s): "WBC", "HGB", "HCT", "PLT" in the last 72 hours. BMET No results for input(s): "NA", "K", "CL", "CO2", "GLUCOSE", "BUN", "CREATININE", "CALCIUM" in the last 72 hours.  Studies/Results: DG Cervical Spine 1 View Result Date: 12/25/2023 CLINICAL DATA:  Elective surgery.  Intraop localization. EXAM: DG CERVICAL SPINE - 1 VIEW COMPARISON:  None Available. FINDINGS: Cross-table lateral view of the cervical spine submitted from the operating room. Surgical instrument localizes posteriorly at the C3 level. IMPRESSION: Cross-table lateral view of the cervical spine for intraoperative localization. Electronically Signed   By: Narda Rutherford M.D.   On: 12/25/2023 15:31   DG O-ARM IMAGE ONLY/NO REPORT Result Date: 12/25/2023 There is no Radiologist interpretation  for this exam.   Assessment/Plan: Patient underwent posterior cervical decompressive laminectomy with posterior cervical instrumentation from C2-T1 by Dr. [name] on 12/25/2023. The patient is working on mobilization and pain control. Will plan for discharge home with home health therapies tomorrow.   LOS: 1 day    I am in  communication with my attending and they agree with the plan for this patient.   Val Eagle, DNP, AGNP-C Nurse Practitioner  [facility] [address], [place], [place] [address] P: [phone]    F: [phone]  12/26/2023, 11:29 AM

## 2023-12-26 NOTE — Evaluation (Signed)
 Physical Therapy Evaluation  Patient Details Name: Tamara Copeland MRN: [id number] DOB: May 27, 1961 Today's Date: 12/26/2023  History of Present Illness  Pt is a 63 yo female s/p C2-3, C3-4, C4-5, C5-6, C6-7 and C7-T1 decompressive laminectomy and posterior cervical instrumentation due to bilateral arm pain numbness tingling and weakness, unsteady gait with MRI showing multilevel stenosis and spinal cord signal change.  Clinical Impression  Pt admitted with above diagnosis. At the time of PT eval, pt was able to demonstrate transfers and ambulation with gross CGA to supervision for safety and RW for support. Pt was educated on precautions, brace application/wearing schedule, appropriate activity progression, and car transfer. Reviewed positioning recommendations in their adjustable bed and use of pillows under arms for shoulder ache relief. Pt currently with functional limitations due to the deficits listed below (see PT Problem List). Pt will benefit from skilled PT to increase their independence and safety with mobility to allow discharge to the venue listed below.          If plan is discharge home, recommend the following: A little help with walking and/or transfers;A little help with bathing/dressing/bathroom;Assistance with cooking/housework;Assist for transportation;Help with stairs or ramp for entrance   Can travel by private vehicle        Equipment Recommendations Rolling walker (2 wheels)  Recommendations for Other Services       Functional Status Assessment Patient has had a recent decline in their functional status and demonstrates the ability to make significant improvements in function in a reasonable and predictable amount of time.     Precautions / Restrictions Precautions Precautions: Cervical Precaution Booklet Issued: Yes (comment) Recall of Precautions/Restrictions: Intact Required Braces or Orthoses: Cervical Brace Cervical Brace: Hard collar;At all times (except off  for showering) Restrictions Weight Bearing Restrictions Per Provider Order: No      Mobility  Bed Mobility Overal bed mobility: Needs Assistance Bed Mobility: Rolling, Sidelying to Sit, Sit to Sidelying Rolling: Supervision, Used rails Sidelying to sit: Supervision, HOB elevated     Sit to sidelying: Supervision General bed mobility comments: VC's throughout for optimal log roll technique. Use of rails required.    Transfers Overall transfer level: Needs assistance Equipment used: Rolling walker (2 wheels) Transfers: Sit to/from Stand Sit to Stand: Contact guard assist           General transfer comment: Close guard provided for safety as pt powered up to full stand. No assist required. VC's for hand placement on seated surface for safety.    Ambulation/Gait Ambulation/Gait assistance: Contact guard assist Gait Distance (Feet): 200 Feet Assistive device: Rolling walker (2 wheels) Gait Pattern/deviations: Step-through pattern, Decreased stride length, Trunk flexed, Narrow base of support Gait velocity: Decreased Gait velocity interpretation: <1.31 ft/sec, indicative of household ambulator   General Gait Details: Very mild L lateral lean throughout that did not seem to change with distance/fatigue level. Pt reports she does not feel she is leaning. No overt LOB noted. Pt had episodes of vertigo during prior ambulation attempts however none reported this session.  Stairs            Wheelchair Mobility     Tilt Bed    Modified Rankin (Stroke Patients Only)       Balance Overall balance assessment: Needs assistance Sitting-balance support: No upper extremity supported, Feet supported Sitting balance-Leahy Scale: Good     Standing balance support: Single extremity supported Standing balance-Leahy Scale: Poor  Pertinent Vitals/Pain Pain Assessment Pain Assessment: Faces Faces Pain Scale: Hurts little more Pain  Location: Neck/shoulders Pain Descriptors / Indicators: Sore, Aching, Heaviness Pain Intervention(s): Limited activity within patient's tolerance, Monitored during session, Repositioned    Home Living Family/patient expects to be discharged to:: Private residence Living Arrangements: Spouse/significant other Available Help at Discharge: Family;Available 24 hours/day Type of Home: House Home Access: Ramped entrance (in garage)       Home Layout: One level Home Equipment: Hand held shower head;Shower seat - built in      Prior Function Prior Level of Function : Independent/Modified Independent             Mobility Comments: Pt reports she was able to get out to the store and walk around PTA - no AD       Extremity/Trunk Assessment   Upper Extremity Assessment Upper Extremity Assessment: Defer to OT evaluation LUE Deficits / Details: digits 1 and 2 feel numb/tingly    Lower Extremity Assessment Lower Extremity Assessment: Generalized weakness    Cervical / Trunk Assessment Cervical / Trunk Assessment: Neck Surgery  Communication   Communication Communication: No apparent difficulties    Cognition Arousal: Alert Behavior During Therapy: WFL for tasks assessed/performed   PT - Cognitive impairments: No apparent impairments                         Following commands: Intact       Cueing Cueing Techniques: Verbal cues     General Comments      Exercises     Assessment/Plan    PT Assessment Patient needs continued PT services  PT Problem List Decreased strength;Decreased activity tolerance;Decreased balance;Decreased mobility;Decreased knowledge of use of DME;Decreased knowledge of precautions;Decreased safety awareness;Pain       PT Treatment Interventions DME instruction;Gait training;Functional mobility training;Therapeutic exercise;Therapeutic activities;Balance training;Patient/family education    PT Goals (Current goals can be found in  the Care Plan section)  Acute Rehab PT Goals Patient Stated Goal: home tomorrow PT Goal Formulation: With patient/family Time For Goal Achievement: 01/02/24 Potential to Achieve Goals: Good    Frequency Min 5X/week     Co-evaluation               AM-PAC PT "6 Clicks" Mobility  Outcome Measure Help needed turning from your back to your side while in a flat bed without using bedrails?: A Little Help needed moving from lying on your back to sitting on the side of a flat bed without using bedrails?: A Little Help needed moving to and from a bed to a chair (including a wheelchair)?: A Little Help needed standing up from a chair using your arms (e.g., wheelchair or bedside chair)?: A Little Help needed to walk in hospital room?: A Little Help needed climbing 3-5 steps with a railing? : A Little 6 Click Score: 18    End of Session Equipment Utilized During Treatment: Gait belt;Cervical collar Activity Tolerance: Patient tolerated treatment well Patient left: in bed;with call bell/phone within reach;with family/visitor present Nurse Communication: Mobility status PT Visit Diagnosis: Unsteadiness on feet (R26.81);Pain Pain - part of body:  (neck)    Time: 8657-8469 PT Time Calculation (min) (ACUTE ONLY): 23 min   Charges:   PT Evaluation $PT Eval Low Complexity: 1 Low PT Treatments $Gait Training: 8-22 mins PT General Charges $$ ACUTE PT VISIT: 1 Visit         Conni Slipper, PT, DPT Acute Rehabilitation Services Secure Chat  Preferred Office: [phone]   Marylynn Pearson 12/26/2023, 11:56 AM

## 2023-12-26 NOTE — Evaluation (Signed)
 Occupational Therapy Evaluation Patient Details Name: Tamara Copeland MRN: [id number] DOB: 1961/01/12 Today's Date: 12/26/2023   History of Present Illness   Pt is a 63 yo female s/p C2-3, C3-4, C4-5, C5-6, C6-7 and C7-T1 decompressive laminectomy and posterior cervical instrumentation due to bilateral arm pain numbness tingling and weakness, unsteady gait with MRI showing multilevel stenosis and spinal cord signal change.     Clinical Impressions This 63 yo female admitted and underwent above presents to acute OT with PLOF of being independent with basic ADLs and IADLs. Currently she is setup-min A for basic ADLs with increased pain limiting activity. She will continue to benefit from acute OT with follow up HHOT and HHAID.     If plan is discharge home, recommend the following:   A little help with walking and/or transfers;A little help with bathing/dressing/bathroom;Assistance with cooking/housework;Assist for transportation;Help with stairs or ramp for entrance     Functional Status Assessment   Patient has had a recent decline in their functional status and demonstrates the ability to make significant improvements in function in a reasonable and predictable amount of time.     Equipment Recommendations   None recommended by OT      Precautions/Restrictions   Precautions Precautions: Cervical Precaution Booklet Issued: Yes (comment) Recall of Precautions/Restrictions: Intact Required Braces or Orthoses: Cervical Brace Cervical Brace: Hard collar;At all times (except off for showering) Restrictions Weight Bearing Restrictions Per Provider Order: No     Mobility Bed Mobility Overal bed mobility: Needs Assistance Bed Mobility: Rolling, Sidelying to Sit, Sit to Sidelying Rolling: Supervision, Used rails Sidelying to sit: Supervision, HOB elevated     Sit to sidelying: Supervision General bed mobility comments: VS for technique    Transfers Overall transfer  level: Needs assistance Equipment used: 1 person hand held assist Transfers: Sit to/from Stand Sit to Stand: Min assist                  Balance Overall balance assessment: Needs assistance Sitting-balance support: No upper extremity supported, Feet supported Sitting balance-Leahy Scale: Good     Standing balance support: Single extremity supported Standing balance-Leahy Scale: Poor                             ADL either performed or assessed with clinical judgement   ADL Overall ADL's : Needs assistance/impaired Eating/Feeding: Modified independent;Sitting Eating/Feeding Details (indicate cue type and reason): educated on using cups with straws and putting a cloth napkin or washcloth between chin and collar when eating to protect collar pads Grooming: Supervision/safety;Set up;Sitting Grooming Details (indicate cue type and reason): educated on using cups with straws and putting a cloth napkin or washcloth between chin and collar when doing mouth care to protect collar pads; two cups (one for rinse and one for spitting) Upper Body Bathing: Minimal assistance;Sitting     Lower Body Bathing Details (indicate cue type and reason): Pt can cross her legs in a figure 4 fashion to get to her feet while seated Upper Body Dressing : Minimal assistance;Sitting   Lower Body Dressing: Minimal assistance;Sit to/from stand Lower Body Dressing Details (indicate cue type and reason): Pt can cross her legs in a figure 4 fashion to get to her feet while seated Toilet Transfer: Minimal assistance Toilet Transfer Details (indicate cue type and reason): simulated side step up towards [facility] Toileting- Clothing Manipulation and Hygiene: Minimal assistance;Sit to/from Nurse, children's Details (indicate  cue type and reason): Educated on use of hand held shower to avoid turning and twisting in shower to get rinsed off         Vision Baseline Vision/History: 1 Wears  glasses Patient Visual Report: No change from baseline              Pertinent Vitals/Pain Pain Assessment Pain Assessment: 0-10 Pain Score: 8  Pain Location: neck--when collar removed to show her how to change out the pads Pain Descriptors / Indicators: Sore, Aching Pain Intervention(s): Limited activity within patient's tolerance, Monitored during session, Repositioned, Patient requesting pain meds-RN notified, RN gave pain meds during session     Extremity/Trunk Assessment Upper Extremity Assessment Upper Extremity Assessment: Overall WFL for tasks assessed;Right hand dominant;LUE deficits/detail LUE Deficits / Details: digits 1 and 2 feel numb/tingly           Communication Communication Communication: No apparent difficulties   Cognition Arousal: Alert Behavior During Therapy: WFL for tasks assessed/performed Cognition: No apparent impairments                               Following commands: Intact       Cueing  Cueing Techniques: Verbal cues              Home Living Family/patient expects to be discharged to:: Private residence Living Arrangements: Spouse/significant other Available Help at Discharge: Family;Available 24 hours/day Type of Home: House Home Access: Ramped entrance (in garage)     Home Layout: One level     Bathroom Shower/Tub: Walk-in shower;Curtain   Bathroom Toilet: Handicapped height     Home Equipment: Hand held shower head;Shower seat - built in          Prior Functioning/Environment Prior Level of Function : Independent/Modified Independent                    OT Problem List: Decreased strength;Decreased range of motion;Decreased activity tolerance;Impaired balance (sitting and/or standing);Pain   OT Treatment/Interventions: Self-care/ADL training;Patient/family education;DME and/or AE instruction;Balance training      OT Goals(Current goals can be found in the care plan section)   Acute  Rehab OT Goals Patient Stated Goal: for pain to be better and go home tomorrow OT Goal Formulation: With patient Time For Goal Achievement: 01/10/24 Potential to Achieve Goals: Good   OT Frequency:  Min 2X/week       AM-PAC OT "6 Clicks" Daily Activity     Outcome Measure Help from another person eating meals?: A Little Help from another person taking care of personal grooming?: A Little Help from another person toileting, which includes using toliet, bedpan, or urinal?: A Little Help from another person bathing (including washing, rinsing, drying)?: A Little Help from another person to put on and taking off regular upper body clothing?: A Little Help from another person to put on and taking off regular lower body clothing?: A Little 6 Click Score: 18   End of Session Nurse Communication: Patient requests pain meds  Activity Tolerance: Patient limited by pain Patient left: in bed;with call bell/phone within reach  OT Visit Diagnosis: Unsteadiness on feet (R26.81);Other abnormalities of gait and mobility (R26.89);Pain Pain - part of body:  (neck--incisional)                Time: 9629-5284 OT Time Calculation (min): 31 min Charges:  OT General Charges $OT Visit: 1 Visit OT Evaluation $OT Eval Moderate Complexity: 1  Mod OT Treatments $Self Care/Home Management : 8-22 mins Lindon Romp OT Acute Rehabilitation Services Office [phone]    Evette Georges 12/26/2023, 9:44 AM

## 2023-12-26 NOTE — TOC Initial Note (Signed)
 Transition of Care [facility]) - Initial/Assessment Note    Patient Details  Name: Tamara Copeland MRN: [id number] Date of Birth: 18-Jan-1961  Transition of [facility]) CM/SW Contact:    Kermit Balo, RN Phone Number: 12/26/2023, 3:55 PM  Clinical Narrative:                  Pt with orders for home health. Choice provided and pt decided on [facility]. Information on the AVS. [facility] will contact her for the first home visit. Spouse will provide supervision at home and transportation to home.  Expected Discharge Plan: Home w Home Health Services Barriers to Discharge: Continued Medical Work up   Patient Goals and CMS Choice   CMS Medicare.gov Compare Post Acute Care list provided to:: Patient Choice offered to / list presented to : Patient, Spouse      Expected Discharge Plan and Services   Discharge Planning Services: CM Consult Post Acute Care Choice: Home Health Living arrangements for the past 2 months: Single Family Home                           HH Arranged: PT, OT, Nurse's Aide HH Agency: [facility] Date [facility] Agency Contacted: 12/26/23   Representative spoke with at [facility] Agency: Haywood Lasso  Prior Living Arrangements/Services Living arrangements for the past 2 months: Single Family Home Lives with:: Spouse Patient language and need for interpreter reviewed:: Yes Do you feel safe going back to the place where you live?: Yes      Need for Family Participation in Patient Care: Yes (Comment) Care giver support system in place?: Yes (comment)   Criminal Activity/Legal Involvement Pertinent to Current Situation/Hospitalization: No - Comment as needed  Activities of Daily Living   ADL Screening (condition at time of admission) Independently performs ADLs?: Yes (appropriate for developmental age) Is the patient deaf or have difficulty hearing?: No Does the patient have difficulty seeing, even when wearing glasses/contacts?: No Does the patient have difficulty  concentrating, remembering, or making decisions?: No  Permission Sought/Granted                  Emotional Assessment Appearance:: Appears stated age Attitude/Demeanor/Rapport: Engaged Affect (typically observed): Accepting Orientation: : Oriented to Self, Oriented to Place, Oriented to  Time, Oriented to Situation   Psych Involvement: No (comment)  Admission diagnosis:  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 [facility]) [T01.60, G99.2] Patient Active Problem List   Diagnosis Date Noted   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 (HCC) 12/25/2023   PCP:  Melvenia Beam, MD Pharmacy:   [phone] [place], [place] - [address], [address] AT [facility] [address], [address] [place]  [address] Phone: [phone] Fax: [phone]     Social Drivers of Health (SDOH) Social History: SDOH Screenings   Food Insecurity: Low Risk  (06/18/2023)   Received from [facility]  Housing: Low Risk  (06/18/2023)   Received from [facility]  Transportation Needs: No Transportation Needs (06/18/2023)   Received from [facility]  Utilities: Low Risk  (06/18/2023)   Received from [facility]  Financial Resource Strain: Low Risk  (01/18/2020)   Received from [facility] visits prior to 12/01/2022.  Physical Activity: Insufficiently Active (01/18/2020)   Received from [facility] visits prior to 12/01/2022.  Social Connections: Socially  Integrated (01/18/2020)   Received from [facility] visits prior to 12/01/2022.  Stress: No Stress Concern Present (01/18/2020)   Received from [facility] visits prior to 12/01/2022.  Tobacco Use: Medium Risk (12/25/2023)   SDOH Interventions:     Readmission Risk Interventions     No data to display

## 2023-12-27 LAB — GLUCOSE, CAPILLARY: Glucose-Capillary: 128 mg/dL — ABNORMAL HIGH (ref 70–99)

## 2023-12-27 MED ORDER — DOCUSATE SODIUM 100 MG PO CAPS: 100.0000 mg | ORAL_CAPSULE | Freq: Two times a day (BID) | ORAL | 0 refills | Status: AC

## 2023-12-27 MED ORDER — OXYCODONE-ACETAMINOPHEN 5-325 MG PO TABS: 1.0000 | ORAL_TABLET | ORAL | 0 refills | Status: AC | PRN

## 2023-12-27 MED ORDER — CYCLOBENZAPRINE HCL 10 MG PO TABS: 10.0000 mg | ORAL_TABLET | Freq: Three times a day (TID) | ORAL | 0 refills | Status: AC | PRN

## 2023-12-27 NOTE — Progress Notes (Signed)
 Occupational Therapy Treatment and Discharge Patient Details Name: Tamara Copeland MRN: [id number] DOB: [birth year] Today's Date: 12/27/2023   History of present illness Pt is a 63 y/o female s/p C2-T1 decompressive laminectomy and posterior cervical instrumentation due to bilateral arm pain N/T and weakness, unsteady gait. PMH significant for CAD, DM, HTN, MI, vertigo, B carpal tunnel release, B elbow surgery, R foot surgery.   OT comments  This 63 yo female seen today to go over pt getting dressed and to educate pt's husband on care of c-collar. All education completed and no further questions from pt or husband, we will sign off with pt D/C'ing today.      If plan is discharge home, recommend the following:  Assistance with cooking/housework;Assist for transportation   Equipment Recommendations  None recommended by OT       Precautions / Restrictions Precautions Precautions: Cervical Precaution Booklet Issued: Yes (comment) Recall of Precautions/Restrictions: Intact Cervical Brace: Hard collar;At all times (off for showering) Restrictions Weight Bearing Restrictions Per Provider Order: No       Mobility Bed Mobility               General bed mobility comments: Pt in recliner upon my arrival    Transfers Overall transfer level: Modified independent   Transfers: Sit to/from Stand                   Balance Overall balance assessment: Mild deficits observed, not formally tested (in standing)                                         ADL either performed or assessed with clinical judgement   ADL                                         General ADL Comments: Pt able to get dressed post setup except for A with her bra (hooks in front but cannot see the hooks due to C-collar. Re-educated and educated pt and husband on c-collar wear and care.    Extremity/Trunk Assessment Upper Extremity Assessment Upper Extremity Assessment:  Overall WFL for tasks assessed            Vision Baseline Vision/History: 1 Wears glasses Patient Visual Report: No change from baseline           Communication Communication Communication: No apparent difficulties   Cognition Arousal: Alert Behavior During Therapy: WFL for tasks assessed/performed Cognition: No apparent impairments                               Following commands: Intact        Cueing   Cueing Techniques: Verbal cues             Pertinent Vitals/ Pain       Pain Assessment Pain Assessment: Faces Faces Pain Scale: Hurts little more Pain Location: Neck/shoulders Pain Descriptors / Indicators: Sore, Aching, Heaviness Pain Intervention(s): Limited activity within patient's tolerance, Monitored during session, Premedicated before session            Progress Toward Goals  OT Goals(current goals can now be found in the care plan section)  Progress towards OT goals:  (all education completed, we will sign off.)  Acute  Rehab OT Goals Patient Stated Goal: to go home         AM-PAC OT "6 Clicks" Daily Activity     Outcome Measure   Help from another person eating meals?: A Little Help from another person taking care of personal grooming?: A Little Help from another person toileting, which includes using toliet, bedpan, or urinal?: A Little Help from another person bathing (including washing, rinsing, drying)?: A Little Help from another person to put on and taking off regular upper body clothing?: A Little Help from another person to put on and taking off regular lower body clothing?: A Little 6 Click Score: 18    End of Session Equipment Utilized During Treatment: Cervical collar  OT Visit Diagnosis: Unsteadiness on feet (R26.81);Other abnormalities of gait and mobility (R26.89);Muscle weakness (generalized) (M62.81);Pain Pain - part of body:  (incisional)   Activity Tolerance Patient tolerated treatment well   Patient Left  with family/visitor present (sitting in recliner)   Nurse Communication  (pt ready to go)        Time: 2130-8657 OT Time Calculation (min): 24 min  Charges: OT General Charges $OT Visit: 1 Visit OT Treatments $Self Care/Home Management : 23-37 mins Lindon Romp OT Acute Rehabilitation Services Office [phone]    Evette Georges 12/27/2023, 11:28 AM

## 2023-12-27 NOTE — Progress Notes (Signed)
 Patient awaiting family for discharge home, Patient in no acute distress nor complaints of pain nor discomfort; incision on hip is clean, dry and intact; No c/o pain at this time. Room was checked and accounted for all patient's belongings; discharge instructions concerning her medications, incision care, follow up appointment and when to call the doctor as needed were all discussed with patient by RN and she expressed understanding on the instructions given.

## 2023-12-27 NOTE — Discharge Instructions (Signed)
Wound Care Keep incision covered and dry until post op day 3. You may remove the Honeycomb dressing on post op day 3. Leave steri-strips on neck  They will fall off by themselves. Do not put any creams, lotions, or ointments on incision. You are fine to shower. Let water run over incision and pat dry.  Activity Walk each and every day, increasing distance each day. No lifting greater than 5 lbs.  Avoid excessive neck motion. No driving for 2 weeks; may ride as a passenger locally.  Diet Resume your normal diet.   Return to Work Will be discussed at your follow up appointment.  Call Your Doctor If Any of These Occur Redness, drainage, or swelling at the wound.  Temperature greater than 101 degrees. Severe pain not relieved by pain medication. Incision starts to come apart.  Follow Up Appt Call [phone] today for appointment in 2-3 weeks if you don't already have one or for any problems.  If you have any hardware placed in your spine, you will need an x-ray before your appointment.

## 2023-12-27 NOTE — Plan of Care (Signed)

## 2023-12-27 NOTE — Progress Notes (Signed)
 Physical Therapy Treatment  Patient Details Name: Tamara Copeland MRN: [id number] DOB: 1960-12-17 Today's Date: 12/27/2023   History of Present Illness Pt is a 63 y/o female s/p C2-T1 decompressive laminectomy and posterior cervical instrumentation due to bilateral arm pain N/T and weakness, unsteady gait. PMH significant for CAD, DM, HTN, MI, vertigo, B carpal tunnel release, B elbow surgery, R foot surgery.    PT Comments  Pt progressing well with post-op mobility. She was able to demonstrate transfers and ambulation with gross CGA to supervision for safety and RW for support. Reinforced education on precautions, brace application/wearing schedule, appropriate activity progression, and car transfer. Will continue to follow.      If plan is discharge home, recommend the following: A little help with walking and/or transfers;A little help with bathing/dressing/bathroom;Assistance with cooking/housework;Assist for transportation;Help with stairs or ramp for entrance   Can travel by private vehicle        Equipment Recommendations  Rolling walker (2 wheels)    Recommendations for Other Services       Precautions / Restrictions Precautions Precautions: Cervical Precaution Booklet Issued: Yes (comment) Recall of Precautions/Restrictions: Intact Required Braces or Orthoses: Cervical Brace Cervical Brace: Hard collar;At all times (except off for showering) Restrictions Weight Bearing Restrictions Per Provider Order: No     Mobility  Bed Mobility Overal bed mobility: Needs Assistance Bed Mobility: Sidelying to Sit   Sidelying to sit: HOB elevated, Modified independent (Device/Increase time)       General bed mobility comments: Pt in sidelying upon PT arrival. She was able to transition to EOB without difficulty.    Transfers Overall transfer level: Needs assistance Equipment used: Rolling walker (2 wheels) Transfers: Sit to/from Stand Sit to Stand: Supervision            General transfer comment: VC's for hand placement on seated surface for safety. No assist required.    Ambulation/Gait Ambulation/Gait assistance: Contact guard assist, Supervision Gait Distance (Feet): 450 Feet Assistive device: Rolling walker (2 wheels) Gait Pattern/deviations: Step-through pattern, Decreased stride length, Trunk flexed, Narrow base of support Gait velocity: Decreased Gait velocity interpretation: 1.31 - 2.62 ft/sec, indicative of limited community ambulator   General Gait Details: Good posture throughout with no unsteadiness or LOB noted. CGA fading to supervision for safety with distance.   Stairs Stairs:  (Deferred as pt has ramp access at home)           Wheelchair Mobility     Tilt Bed    Modified Rankin (Stroke Patients Only)       Balance Overall balance assessment: Needs assistance Sitting-balance support: No upper extremity supported, Feet supported Sitting balance-Leahy Scale: Good     Standing balance support: Single extremity supported Standing balance-Leahy Scale: Poor                              Communication Communication Communication: No apparent difficulties  Cognition Arousal: Alert Behavior During Therapy: WFL for tasks assessed/performed   PT - Cognitive impairments: No apparent impairments                         Following commands: Intact      Cueing Cueing Techniques: Verbal cues  Exercises      General Comments        Pertinent Vitals/Pain Pain Assessment Pain Assessment: Faces Faces Pain Scale: Hurts a little bit Pain Location: Neck/shoulders Pain Descriptors / Indicators: Sore,  Aching, Heaviness Pain Intervention(s): Limited activity within patient's tolerance, Monitored during session, Repositioned    Home Living                          Prior Function            PT Goals (current goals can now be found in the care plan section) Acute Rehab PT Goals Patient  Stated Goal: Home today PT Goal Formulation: With patient Time For Goal Achievement: 01/02/24 Potential to Achieve Goals: Good Progress towards PT goals: Progressing toward goals    Frequency    Min 5X/week      PT Plan      Co-evaluation              AM-PAC PT "6 Clicks" Mobility   Outcome Measure  Help needed turning from your back to your side while in a flat bed without using bedrails?: A Little Help needed moving from lying on your back to sitting on the side of a flat bed without using bedrails?: A Little Help needed moving to and from a bed to a chair (including a wheelchair)?: A Little Help needed standing up from a chair using your arms (e.g., wheelchair or bedside chair)?: A Little Help needed to walk in hospital room?: A Little Help needed climbing 3-5 steps with a railing? : A Little 6 Click Score: 18    End of Session Equipment Utilized During Treatment: Gait belt;Cervical collar Activity Tolerance: Patient tolerated treatment well Patient left: in bed;with call bell/phone within reach;with family/visitor present Nurse Communication: Mobility status PT Visit Diagnosis: Unsteadiness on feet (R26.81);Pain Pain - part of body:  (neck)     Time: 4098-1191 PT Time Calculation (min) (ACUTE ONLY): 16 min  Charges:    $Gait Training: 8-22 mins PT General Charges $$ ACUTE PT VISIT: 1 Visit                     Conni Slipper, PT, DPT Acute Rehabilitation Services Secure Chat Preferred Office: [phone]    Marylynn Pearson 12/27/2023, 9:17 AM

## 2023-12-27 NOTE — Discharge Summary (Signed)
 Physician Discharge Summary     Providing Compassionate, Quality Care - Together   Patient ID: Tamara Copeland MRN: [id number] DOB/AGE: 1961-04-08 63 y.o.  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 [name] on 12/25/2023 . She was admitted to 3C04  following recovery from anesthesia in the PACU. Her postoperative course has been uncomplicated. She has worked with both physical and occupational therapies who feel the patient is ready for discharge home with home health therapies. She is ambulating with the aid of a walker. She is tolerating a normal diet. She is not having any bowel or bladder dysfunction. Her pain is well-controlled with oral pain medication. She is ready for discharge home.   Consults: PT/OT/TOC  Significant Diagnostic Studies: radiology: DG Cervical Spine 1 View Result Date: 12/25/2023 CLINICAL DATA:  Elective surgery.  Intraop localization. EXAM: DG CERVICAL SPINE - 1 VIEW COMPARISON:  None Available. FINDINGS: Cross-table lateral view of the cervical spine submitted from the operating room. Surgical instrument localizes posteriorly at the C3 level. IMPRESSION: Cross-table lateral view of the cervical spine for intraoperative localization. Electronically Signed   By: Narda Rutherford M.D.   On: 12/25/2023 15:31   DG O-ARM IMAGE ONLY/NO REPORT Result Date: 12/25/2023 There is no Radiologist interpretation  for this exam.    Treatments: surgery: C2-3, C3-4, C4-5, C5-6, C6-7 and C7-T1 decompressive laminectomy; posterior cervical instrumentation C2-T1 with Zimmer titanium screws and rods; C2-3, C3-4, C4-5, C5-6, C6-7 and C7-T1 posterior arthrodesis with local morselized autograft bone and  intra grow bone graft extender; application of Stealth spinal navigation   Discharge Exam: Blood pressure (!) 144/75, pulse 71, temperature 99.1 F (37.3 C), temperature source Oral, resp. rate 20, height 5\' 3"  (1.6 m), weight 85.3 kg, SpO2 99%.  Alert and oriented x 4 PERRLA CN II-XII grossly intact MAE, Generalized weakness Incision is covered with Honeycomb dressing and Steri Strips; Dressing is clean, dry, and intact  Disposition: Discharge disposition: 06-Home-Health Care Svc       Discharge Instructions     Call MD for:  difficulty breathing, headache or visual disturbances   Complete by: As directed    Call MD for:  hives   Complete by: As directed    Call MD for:  persistant dizziness or light-headedness   Complete by: As directed    Call MD for:  persistant nausea and vomiting   Complete by: As directed    Call MD for:  redness, tenderness, or signs of infection (pain, swelling, redness, odor or green/yellow discharge around incision site)   Complete by: As directed    Call MD for:  severe uncontrolled pain   Complete by: As directed    Diet - low sodium heart healthy   Complete by: As directed    If the dressing is still on your incision site when you go home, remove it on the third day after your surgery date. Remove dressing if it begins to fall off, or if it is dirty or damaged before the third day.   Complete by: As directed    Increase activity slowly   Complete by: As directed       Allergies as of 12/27/2023       Reactions   Lisinopril Swelling  Lip swelling        Medication List     TAKE these medications    albuterol 108 (90 Base) MCG/ACT inhaler Commonly known as: VENTOLIN HFA Inhale 1-2 puffs into the lungs every 6 (six) hours as needed for wheezing or shortness of breath.   amLODipine 5 MG tablet Commonly known as: NORVASC Take 5 mg by mouth in the morning.   aspirin EC 81 MG tablet Take 81 mg by mouth every evening. Swallow  whole.   clindamycin 1 % lotion Commonly known as: CLEOCIN T Apply 1 Application topically 2 (two) times daily as needed (skin irritation.).   CoQ10 200 MG Caps Take 200 mg by mouth in the morning.   cyclobenzaprine 10 MG tablet Commonly known as: FLEXERIL Take 1 tablet (10 mg total) by mouth 3 (three) times daily as needed for muscle spasms.   docusate sodium 100 MG capsule Commonly known as: COLACE Take 1 capsule (100 mg total) by mouth 2 (two) times daily.   escitalopram 20 MG tablet Commonly known as: LEXAPRO Take 20 mg by mouth every evening.   ferrous sulfate 325 (65 FE) MG tablet Take 325 mg by mouth every Monday, Wednesday, and Friday at 8 PM. In the evening.   Jardiance 25 MG Tabs tablet Generic drug: empagliflozin Take 25 mg by mouth in the morning.   Lantus SoloStar 100 UNIT/ML Solostar Pen Generic drug: insulin glargine Inject 10 Units into the skin every evening.   metFORMIN 1000 MG tablet Commonly known as: GLUCOPHAGE Take 1,000 mg by mouth in the morning and at bedtime.   metoprolol tartrate 25 MG tablet Commonly known as: LOPRESSOR Take 25 mg by mouth in the morning.   Metoprolol Tartrate 75 MG Tabs Take 75 mg by mouth in the morning and at bedtime.   nitroGLYCERIN 0.4 MG SL tablet Commonly known as: NITROSTAT Place 0.4 mg under the tongue every 5 (five) minutes x 3 doses as needed for chest pain.   omeprazole 40 MG capsule Commonly known as: PRILOSEC Take 40 mg by mouth every evening.   oxyCODONE-acetaminophen 5-325 MG tablet Commonly known as: Percocet Take 1-2 tablets by mouth every 4 (four) hours as needed.   pioglitazone 15 MG tablet Commonly known as: ACTOS Take 15 mg by mouth in the morning.   PRESERVISION AREDS 2 PO Take 1 tablet by mouth in the morning and at bedtime.   rosuvastatin 40 MG tablet Commonly known as: CRESTOR Take 40 mg by mouth every evening.   Vitamin B-12 2500 MCG Subl Take 2,500 mcg by mouth in the morning.                Discharge Care Instructions  (From admission, onward)           Start     Ordered   12/27/23 0000  If the dressing is still on your incision site when you go home, remove it on the third day after your surgery date. Remove dressing if it begins to fall off, or if it is dirty or damaged before the third day.        12/27/23 0842            Follow-up Information     [facility] Follow up.   Specialty: Home Health Services Why: [facility] will contact you for the first home visit Contact information: [address] [place] [address] [phone]         Tressie Stalker,  MD. Nyra Capes on 01/21/2024.   Specialty: Neurosurgery Why: First post op appointment with x-rays is on 01/21/2024 at 8:30 AM. Contact information: [address] [place] [address] [phone]                 Signed: Val Eagle, DNP, AGNP-C Nurse Practitioner  [facility] [address], [place], [place] [address] P: [phone]    F: [phone]  12/27/2023, 8:42 AM

## 2023-12-28 ENCOUNTER — Encounter (HOSPITAL_COMMUNITY): Payer: Self-pay | Admitting: Neurosurgery

## 2024-01-04 ENCOUNTER — Encounter (HOSPITAL_BASED_OUTPATIENT_CLINIC_OR_DEPARTMENT_OTHER): Payer: Self-pay | Admitting: Emergency Medicine

## 2024-01-04 ENCOUNTER — Other Ambulatory Visit: Payer: Self-pay

## 2024-01-04 ENCOUNTER — Emergency Department (HOSPITAL_BASED_OUTPATIENT_CLINIC_OR_DEPARTMENT_OTHER)
Admission: EM | Admit: 2024-01-04 | Discharge: 2024-01-04 | Disposition: A | Discharge status: Home / Self Care | Attending: Emergency Medicine | Admitting: Emergency Medicine

## 2024-01-04 DIAGNOSIS — G8918 Other acute postprocedural pain: Secondary | ICD-10-CM | POA: Diagnosis not present

## 2024-01-04 DIAGNOSIS — M79622 Pain in left upper arm: Secondary | ICD-10-CM | POA: Diagnosis present

## 2024-01-04 MED ORDER — OXYCODONE-ACETAMINOPHEN 5-325 MG PO TABS
1.0000 | ORAL_TABLET | Freq: Once | ORAL | Status: AC
  Administered 2024-01-04: 1 via ORAL
  Filled 2024-01-04: qty 1

## 2024-01-04 MED ORDER — METHYLPREDNISOLONE 4 MG PO TBPK: ORAL_TABLET | ORAL | 0 refills | Status: AC

## 2024-01-04 MED ORDER — DEXAMETHASONE SODIUM PHOSPHATE 10 MG/ML IJ SOLN
10.0000 mg | Freq: Once | INTRAMUSCULAR | Status: AC
  Administered 2024-01-04: 10 mg via INTRAMUSCULAR
  Filled 2024-01-04: qty 1

## 2024-01-04 MED ORDER — DIAZEPAM 5 MG PO TABS
5.0000 mg | ORAL_TABLET | Freq: Once | ORAL | Status: AC
  Administered 2024-01-04: 5 mg via ORAL
  Filled 2024-01-04: qty 1

## 2024-01-04 NOTE — ED Provider Notes (Signed)
 [facility] Provider Note   CSN: [id number] Arrival date & time: 01/04/24  1909     History  Chief Complaint  Patient presents with   Arm Pain    Tamara Copeland is a 63 y.o. female.  Patient to ED with sharp, shooting, intermittent left upper arm pain. She had cervical surgery 3/26 treating cervical spondylosis with myelopathy and underwent  POSTERIOR CERVICAL LAM, C23,C34,C45,C56,C67; POSTERIOR INSTRUMENTATION AND FUSION C2-T1. She has been doing well since the surgery until today when she had sudden onset of severe left arm pain as described. No neck pain. She reports removing the collar only for bathing but no twists, falls, or injury of the neck.   The history is provided by the patient. No language interpreter was used.  Arm Pain       Home Medications Prior to Admission medications   Medication Sig Start Date End Date Taking? Authorizing Provider  methylPREDNISolone (MEDROL DOSEPAK) 4 MG TBPK tablet Dispense per pack directions 01/04/24  Yes Tamara Busche, PA-C  albuterol (VENTOLIN HFA) 108 (90 Base) MCG/ACT inhaler Inhale 1-2 puffs into the lungs every 6 (six) hours as needed for wheezing or shortness of breath.    [provider]  amLODipine (NORVASC) 5 MG tablet Take 5 mg by mouth in the morning.    [provider]  aspirin EC 81 MG tablet Take 81 mg by mouth every evening. Swallow whole.    [provider]  clindamycin (CLEOCIN T) 1 % lotion Apply 1 Application topically 2 (two) times daily as needed (skin irritation.).    [provider]  Coenzyme Q10 (COQ10) 200 MG CAPS Take 200 mg by mouth in the morning.    [provider]  Cyanocobalamin (VITAMIN B-12) 2500 MCG SUBL Take 2,500 mcg by mouth in the morning.    [provider]  cyclobenzaprine (FLEXERIL) 10 MG tablet Take 1 tablet (10 mg total) by mouth 3 (three) times daily as needed for muscle spasms. 12/27/23   Tamara Eagle D, NP  docusate sodium (COLACE) 100 MG capsule Take 1 capsule (100 mg total) by mouth 2 (two) times daily. 12/27/23   Tamara Eagle D, NP  empagliflozin (JARDIANCE) 25 MG TABS tablet Take 25 mg by mouth in the morning.    [provider]  escitalopram (LEXAPRO) 20 MG tablet Take 20 mg by mouth every evening.    [provider]  ferrous sulfate 325 (65 FE) MG tablet Take 325 mg by mouth every Monday, Wednesday, and Friday at 8 PM. In the evening.    [provider]  insulin glargine (LANTUS SOLOSTAR) 100 UNIT/ML Solostar Pen Inject 10 Units into the skin every evening.    [provider]  metFORMIN (GLUCOPHAGE) 1000 MG tablet Take 1,000 mg by mouth in the morning and at bedtime.    [provider]  metoprolol tartrate (LOPRESSOR) 25 MG tablet Take 25 mg by mouth in the morning.    [provider]  Metoprolol Tartrate 75 MG TABS Take 75 mg by mouth in the morning and at bedtime.    [provider]  Multiple Vitamins-Minerals (PRESERVISION AREDS 2 PO) Take 1 tablet by mouth in the morning and at bedtime.    [provider]  nitroGLYCERIN (NITROSTAT) 0.4 MG SL tablet Place 0.4 mg under the tongue every 5 (five) minutes x 3 doses as needed for chest pain.    [provider]  omeprazole (PRILOSEC) 40 MG capsule  Take 40 mg by mouth every evening.    [provider]  oxyCODONE-acetaminophen (PERCOCET) 5-325 MG tablet Take 1-2 tablets by mouth every 4 (four) hours as needed. 12/27/23 12/26/24  Tamara Eagle D, NP  pioglitazone (ACTOS) 15 MG tablet Take 15 mg by mouth in the morning.    [provider]  rosuvastatin (CRESTOR) 40 MG tablet Take 40 mg by mouth every evening.    [provider]      Allergies    Lisinopril    Review of Systems   Review of Systems  Physical Exam Updated Vital Signs BP 135/87 (BP Location: Right Arm)   Pulse 78   Temp 98.3 F (36.8 C) (Oral)   Resp 20    Ht 5\' 3"  (1.6 m)   Wt 85.3 kg   SpO2 94%   BMI 33.31 kg/m  Physical Exam Vitals and nursing note reviewed.  Constitutional:      Appearance: She is well-developed.  HENT:     Head: Normocephalic.  Neck:     Comments: Steri-strips in place over cervical to upper thoracic spine. No localized swelling, redness, wound dehiscence or tenderness.  Cardiovascular:     Rate and Rhythm: Normal rate.  Pulmonary:     Effort: Pulmonary effort is normal.  Musculoskeletal:        General: Normal range of motion.     Cervical back: Normal range of motion and neck supple.  Skin:    General: Skin is warm and dry.  Neurological:     General: No focal deficit present.     Mental Status: She is alert and oriented to person, place, and time.     Comments: Left UE has no strength or sensory deficits when compared with the right.      ED Results / Procedures / Treatments   Labs (all labs ordered are listed, but only abnormal results are displayed) Labs Reviewed - No data to display  EKG None  Radiology No results found.  Procedures Procedures    Medications Ordered in ED Medications  oxyCODONE-acetaminophen (PERCOCET/ROXICET) 5-325 MG per tablet 1 tablet (1 tablet Oral Given 01/04/24 2004)  diazepam (VALIUM) tablet 5 mg (5 mg Oral Given 01/04/24 2004)  dexamethasone (DECADRON) injection 10 mg (10 mg Intramuscular Given 01/04/24 2021)    ED Course/ Medical Decision Making/ A&P Clinical Course as of 01/04/24 2039  Sat Jan 04, 2024  1949 Patient here 9 days post-surgery for cervical fusion Tamara Copeland). Tonight had sudden onset sharp pain in the left upper arm. Neurologically intact. Surgical incision is well appearing without infection, bleeding, dehiscence. Pain addressed in ED.  [SU]  2033 Discussed with neurosurgery who advised no imaging indicated tonight. Recommended dose decadron in the ED and home with medrol dose pack (6-day). She will be able to follow up with Dr. Lovell Copeland in the office  this coming week.  [SU]    Clinical Course User Index [SU] Tamara Anis, PA-C                                 Medical Decision Making Risk Prescription drug management.           Final Clinical Impression(s) / ED Diagnoses Final diagnoses:  Post-op pain    Rx / DC Orders ED Discharge Orders          Ordered    methylPREDNISolone (MEDROL DOSEPAK) 4 MG TBPK tablet  01/04/24 2037              Tamara Anis, PA-C 01/04/24 2039    Benjiman Core, MD 01/05/24 1515

## 2024-01-04 NOTE — Discharge Instructions (Signed)
 Continue your regular medications for pain. Take the medrol dose-pack as directed. You can pick up and start the prescription tomorrow. Call Dr. [name]' office Monday to schedule a recheck appointment for this coming week.

## 2024-01-04 NOTE — ED Triage Notes (Addendum)
 Pt had a procedure on her neck last Thursday at [facility]. Started having shooting pains in L arm "like a charlie horse". Per EMS pt reports pain is "12/10".  VSS per EMS.  Pt arrives in c-collar from home.
